# Patient Record
Sex: Female | Born: 2005 | Race: White | Hispanic: No | Marital: Single | Smoking: Never smoker
Health system: Southern US, Community
[De-identification: ages and names within clinical notes are randomized; demographics above are authoritative.]

## PROBLEM LIST (undated history)

## (undated) ENCOUNTER — Ambulatory Visit: Admission: EM | Payer: BC Managed Care – PPO | Source: Home / Self Care

## (undated) ENCOUNTER — Ambulatory Visit

## (undated) DIAGNOSIS — R011 Cardiac murmur, unspecified: Secondary | ICD-10-CM

## (undated) DIAGNOSIS — M0800 Unspecified juvenile rheumatoid arthritis of unspecified site: Secondary | ICD-10-CM

## (undated) DIAGNOSIS — M5481 Occipital neuralgia: Secondary | ICD-10-CM

## (undated) DIAGNOSIS — N83209 Unspecified ovarian cyst, unspecified side: Secondary | ICD-10-CM

## (undated) DIAGNOSIS — IMO0002 Reserved for concepts with insufficient information to code with codable children: Secondary | ICD-10-CM

## (undated) DIAGNOSIS — R609 Edema, unspecified: Secondary | ICD-10-CM

## (undated) DIAGNOSIS — E0590 Thyrotoxicosis, unspecified without thyrotoxic crisis or storm: Secondary | ICD-10-CM

## (undated) DIAGNOSIS — E282 Polycystic ovarian syndrome: Secondary | ICD-10-CM

## (undated) DIAGNOSIS — M081 Juvenile ankylosing spondylitis: Secondary | ICD-10-CM

## (undated) DIAGNOSIS — D649 Anemia, unspecified: Secondary | ICD-10-CM

## (undated) HISTORY — DX: Cardiac murmur, unspecified: R01.1

## (undated) HISTORY — DX: Polycystic ovarian syndrome: E28.2

## (undated) HISTORY — DX: Anemia, unspecified: D64.9

## (undated) HISTORY — DX: Occipital neuralgia: M54.81

## (undated) HISTORY — DX: Edema, unspecified: R60.9

## (undated) HISTORY — PX: WISDOM TOOTH EXTRACTION: SHX21

## (undated) HISTORY — DX: Unspecified ovarian cyst, unspecified side: N83.209

## (undated) HISTORY — DX: Reserved for concepts with insufficient information to code with codable children: IMO0002

## (undated) HISTORY — DX: Unspecified juvenile rheumatoid arthritis of unspecified site: M08.00

## (undated) HISTORY — DX: Juvenile ankylosing spondylitis: M08.1

---
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
## 2023-01-03 NOTE — Telephone Encounter (Signed)
Same recommendations. If pain is still present, ultrasound recommended.

## 2023-01-03 NOTE — Telephone Encounter (Signed)
Got back in touch with pt and she provided me with an update. States that since LMP of 12/25/22-01/01/23, her RLQ pains peaked on the last day and noticed some clotting that day as well but since then has only had some achiness on the right side. States she feels as if cyst had ruptured.  Please advise if recommendations have changed/remained the same? Would you still like pt to schedule an [place] appt? Thanks.

## 2023-01-05 NOTE — Telephone Encounter (Signed)
FYI. Pt scheduled for [place] w/ visit on 02/23/2023. Will add order and route to provider for final review and close encounter.

## 2023-01-13 ENCOUNTER — Ambulatory Visit (INDEPENDENT_AMBULATORY_CARE_PROVIDER_SITE_OTHER): Payer: BC Managed Care – PPO | Admitting: Pediatrics

## 2023-01-13 ENCOUNTER — Encounter (INDEPENDENT_AMBULATORY_CARE_PROVIDER_SITE_OTHER): Payer: Self-pay | Admitting: Pediatrics

## 2023-01-13 VITALS — BP 112/64 | HR 70 | Ht 65.75 in | Wt 150.0 lb

## 2023-01-13 DIAGNOSIS — E0789 Other specified disorders of thyroid: Secondary | ICD-10-CM

## 2023-01-13 DIAGNOSIS — E282 Polycystic ovarian syndrome: Secondary | ICD-10-CM

## 2023-01-13 NOTE — Assessment & Plan Note (Signed)
-  last free testosterone level was normal

## 2023-01-13 NOTE — Progress Notes (Signed)
Pediatric Endocrinology Consultation Follow-up Visit  Michelle Snow Feb 09, 2006 [id number]  HPI: Michelle Snow  is a 17 y.o. female presenting for follow-up of suppressed TSH and history of goiter.  she is accompanied to this visit by her self.  Michelle Snow was last seen at [facility] on 10/14/2022.  Since last visit, she has ovarian rupture February and March 2024. She is under the care of Gyn and has appointment for [place]. She has not started OCP for concern of lymphedema. No symptoms of thyroid disease.   ROS: Greater than 10 systems reviewed with pertinent positives listed in HPI, otherwise neg. The following portions of the patient's history were reviewed and updated as appropriate:  Past Medical History:  has a past medical history of Anemia, Baby premature 28-32 weeks, Heart murmur, JRA (juvenile rheumatoid arthritis), Juvenile ankylosing spondylitis, Lipedema, PCOS (polycystic ovarian syndrome), and Ruptured ovarian cyst.   Meds: Current Outpatient Medications  Medication Instructions   Adalimumab 40 MG/0.4ML PNKT Subcutaneous   folic acid (FOLVITE) 1 MG tablet 1 tablet, Oral, Daily   methotrexate (RHEUMATREX) 2.5 MG tablet Oral   Multiple Vitamin (MULTIVITAMIN) tablet 1 tablet, Oral, Daily,       Allergies: Allergies  Allergen Reactions   Amoxicillin-Pot Clavulanate     REACTION: nausea- but tolerates amoxil- this was verified with mother 02/04/11    Surgical History: No past surgical history on file.   Family History: family history includes Allergies in her mother; Ankylosing spondylitis in her mother; Cancer in her maternal grandmother; Celiac disease in her maternal grandmother and mother; Fibromyalgia in her mother; Hypothyroidism in her maternal aunt, maternal grandmother, and mother.   Social History: Social History   Social History Narrative   Mom is Engineer, site.  Dad is Games developer.      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 23-24 school year.       reports that she has never smoked. She has never used smokeless tobacco. She reports that she does not drink alcohol and does not use drugs.   Physical Exam:  Vitals:   01/13/23 1354  BP: 112/64  Pulse: 70  Weight: 150 lb (68 kg)  Height: 5' 5.75" (1.67 m)   BP 112/64   Pulse 70   Ht 5' 5.75" (1.67 m)   Wt 150 lb (68 kg)   LMP 11/16/2022 (Exact Date)   BMI 24.40 kg/m  Body mass index: body mass index is 24.4 kg/m. Blood pressure %iles are not available for patients who are 18 years or older. 77 %ile (Z= 0.74) based on CDC (Girls, 2-20 Years) BMI-for-age based on BMI available as of 01/13/2023.   Wt Readings from Last 3 Encounters:  01/13/23 150 lb (68 kg) (83 %, Z= 0.94)*  12/21/22 148 lb (67.1 kg) (81 %, Z= 0.88)*  11/12/22 150 lb (68 kg) (83 %, Z= 0.95)*   * Growth percentiles are based on CDC (Girls, 2-20 Years) data.   Ht Readings from Last 3 Encounters:  01/13/23 5' 5.75" (1.67 m) (72 %, Z= 0.58)*  12/21/22 5' 6.5" (1.689 m) (81 %, Z= 0.88)*  11/12/22 5\' 7"  (1.702 m) (86 %, Z= 1.08)*   * Growth percentiles are based on CDC (Girls, 2-20 Years) data.    Physical Exam Vitals reviewed.  Constitutional:      Appearance: Normal appearance. She is not toxic-appearing.  HENT:     Head: Normocephalic and atraumatic.     Nose: Nose normal.     Mouth/Throat:     Mouth:  Mucous membranes are moist.  Eyes:     Extraocular Movements: Extraocular movements intact.  Neck:     Comments: No goiter Pulmonary:     Effort: Pulmonary effort is normal. No respiratory distress.  Abdominal:     General: There is no distension.  Musculoskeletal:        General: Normal range of motion.     Cervical back: Normal range of motion and neck supple. No tenderness.  Lymphadenopathy:     Cervical: No cervical adenopathy.  Skin:    General: Skin is warm.  Neurological:     General: No focal deficit present.     Mental Status: She is alert.     Gait: Gait normal.  Psychiatric:         Mood and Affect: Mood normal.        Behavior: Behavior normal.        Thought Content: Thought content normal.        Judgment: Judgment normal.      Labs: Results for orders placed or performed in visit on 12/21/22  Hemoglobin A1c  Result Value Ref Range   Hgb A1c MFr Bld 5.4 <5.7 % of total Hgb   Mean Plasma Glucose 108 mg/dL   eAG (mmol/L) 6.0 mmol/L  Testos,Total,Free and SHBG (Female)  Result Value Ref Range   Testosterone, Total, LC-MS-MS 62 (H) 2 - 45 ng/dL   Free Testosterone 4.5 0.1 - 6.4 pg/mL   Sex Hormone Binding 81.4 17 - 124 nmol/L    Assessment/Plan: Michelle Snow is a 17 y.o. 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 A diagnosis of PCOS (polycystic ovarian syndrome) was also pertinent to this visit.Michelle Snow.  Michelle Snow was seen today for complex endocrine disorder of thyroid.  Complex endocrine disorder of thyroid Overview: She had suppressed TSH with normal thyroxine and negative thyroid Abs x3 in June 2023. TSI was repeated January 2024 and TSI was normal at <89%. Thus, she is not at risk of developing autoimmune thyroid disease.   Orders: -     T4, free -     TSH -     T3  PCOS (polycystic ovarian syndrome) -     DHEA-sulfate      Follow-up:   Return in about 1 year (around 01/13/2024) for to obtain labs and follow up.   Medical decision-making:  I have personally spent 31 minutes involved in face-to-face and non-face-to-face activities for this patient on the day of the visit. Professional time spent includes the following activities, in addition to those noted in the documentation: preparation time/chart review, ordering of medications/tests/procedures, obtaining and/or reviewing separately obtained history, counseling and educating the patient/family/caregiver, performing a medically appropriate examination and/or evaluation, referring and communicating with other health care professionals for care coordination, and  documentation in the EHR.  Thank you for the opportunity to participate in the care of your patient. Please do not hesitate to contact me should you have any questions regarding the assessment or treatment plan.   Sincerely,   Silvana Newnessolette Zaila Crew, MD  Addendum: Thyroid labs are stable, so no treatment needed at this time. I recommend annual thyroid function tests. DHEA-s is elevated and commonly seen in PCOS.  Latest Reference Range & Units 01/13/23 14:25  TSH mIU/L 0.19 (L)  Triiodothyronine (T3) 86 - 192 ng/dL 161113  W9,UEAV(WUJWJXT4,Free(Direct) 0.8 - 1.4 ng/dL 1.1  (L): Data is abnormally low   Latest Reference Range & Units 01/13/23 14:25  DHEA-SO4 44 - 286  mcg/dL 161323 (H)  (H): Data is abnormally high

## 2023-01-13 NOTE — Assessment & Plan Note (Signed)
-  clinically and biochemially euthyroid -last TSH was still suppressed (last normal 2021) -January 2024 thyroxine level was normal -No treatment is indicated as thyroid hormone levels have continued to be normal and no goiter today -recommend follow up TFTs in 1 year with TSH and free T4 that can be obtained by me or her PCP. Recommend referral to adult endo by adult PCP only if thyroxine level is abnormal.

## 2023-01-13 NOTE — Patient Instructions (Addendum)
Latest Reference Range & Units Most Recent  TSH mIU/L 0.26 (L) 10/14/22 10:49  Triiodothyronine (T3) 86 - 192 ng/dL 469 03/11/94 28:41  L2,GMWN(UUVOZD) 0.8 - 1.4 ng/dL 1.2 03/16/43 03:47  Thyroxine (T4) 5.3 - 11.7 mcg/dL 8.1 01/09/58 56:38  Free Thyroxine Index 1.4 - 3.8  2.5 01/10/22 16:22  Thyroglobulin Ab < or = 1 IU/mL <1 04/05/22 14:19  Thyroperoxidase Ab SerPl-aCnc <9 IU/mL 1 04/05/22 14:19  T3 Uptake 22 - 35 % 31 01/10/22 16:22  THYROID STIMULATING IMMUNOGLOBULIN  Rpt 10/14/22 10:49 <89%  (L): Data is abnormally low Rpt: View report in Results Review for more information  It was a pleasure seeing you today. If TSH is the usual lower end, but still normal thyroxine and normal T3, we have no plans to treat, since the thyroid hormone level is normal. I would recommend having annual TSH and Free T4 levels drawn by your general physician and refer to adult endocrinology if the thyroxine level was to become high or low.   Please let your gynecologist know that a DHEA-s was obtained to trend the level for monitoring of your PCOS.

## 2023-01-14 LAB — TSH: TSH: 0.19 mIU/L — ABNORMAL LOW

## 2023-01-14 LAB — T4, FREE: Free T4: 1.1 ng/dL (ref 0.8–1.4)

## 2023-01-14 LAB — DHEA-SULFATE: DHEA-SO4: 323 ug/dL — ABNORMAL HIGH (ref 44–286)

## 2023-01-14 LAB — T3: T3, Total: 113 ng/dL (ref 86–192)

## 2023-02-23 ENCOUNTER — Other Ambulatory Visit: Payer: BC Managed Care – PPO | Admitting: Nurse Practitioner

## 2023-02-23 ENCOUNTER — Other Ambulatory Visit: Payer: BC Managed Care – PPO

## 2023-02-23 ENCOUNTER — Telehealth: Payer: Self-pay

## 2023-02-23 ENCOUNTER — Other Ambulatory Visit: Payer: Self-pay | Admitting: Nurse Practitioner

## 2023-02-23 DIAGNOSIS — R5383 Other fatigue: Secondary | ICD-10-CM

## 2023-02-23 NOTE — Telephone Encounter (Signed)
Pt scheduled for her earliest availability on 03/01/23 @ 11am for lab only appt.   In meantime, pt advised to push fluids and protein/iron rich diet. Pt voiced understanding. Will route to provider for final review and close.

## 2023-02-23 NOTE — Telephone Encounter (Signed)
Yes, she can schedule lab visit to check for anemia. I also recommend checking for vitamin deficiencies and will place orders for that as well.

## 2023-02-23 NOTE — Telephone Encounter (Signed)
Pt calling to request appt to have iron levels checked. States that she has been dealing w/ sxs of fatigue (coming home after work and napping), low energy, and lightheadedness with standing that's been mainly noticeable/more intense within the last week. Pt reports that she did have a syncopal episode w/out injury for ~32min about a month ago. Denies notifying anyone about it. Denies any more sensations of ovarian cyst ruptures, feels as if there may be one that's formed on rt ovary but nothing too painful at the moment  LMP: 02/18/2023-still on cycle but lightening up.   Please advise.

## 2023-02-23 NOTE — Progress Notes (Deleted)
   Acute Office Visit  Subjective:    Patient ID: Michelle Snow, female    DOB: [birth year], 17 y.o.   MRN: [id number]   HPI 17 y.o. presents today for ultrasound. Seen 12/21/22 with complaints of LLQ pain. Seen 11/16/2022 following ER visit for suspected ruptured right ovarian cyst. This pain has resolved but she is having intermittent mild LLQ pain since then. Physiologic cyst 2.2 cm seen on CT at ER visit.  No LMP recorded.    Review of Systems     Objective:    Physical Exam  There were no vitals taken for this visit. Wt Readings from Last 3 Encounters:  01/13/23 150 lb (68 kg) (83 %, Z= 0.94)*  12/21/22 148 lb (67.1 kg) (81 %, Z= 0.88)*  11/12/22 150 lb (68 kg) (83 %, Z= 0.95)*   * Growth percentiles are based on CDC (Girls, 2-20 Years) data.        Patient informed chaperone available to be present for breast and/or pelvic exam. Patient has requested no chaperone to be present. Patient has been advised what will be completed during breast and pelvic exam.   Assessment & Plan:   Problem List Items Addressed This Visit   None       Olivia Mackie DNP, 7:48 AM 02/23/2023

## 2023-03-01 ENCOUNTER — Other Ambulatory Visit: Payer: BC Managed Care – PPO

## 2023-03-01 DIAGNOSIS — R5383 Other fatigue: Secondary | ICD-10-CM

## 2023-03-04 LAB — COMPREHENSIVE METABOLIC PANEL
AG Ratio: 1.6 (calc) (ref 1.0–2.5)
ALT: 17 U/L (ref 5–32)
AST: 19 U/L (ref 12–32)
Albumin: 4.4 g/dL (ref 3.6–5.1)
Alkaline phosphatase (APISO): 74 U/L (ref 36–128)
BUN: 7 mg/dL (ref 7–20)
CO2: 30 mmol/L (ref 20–32)
Calcium: 9.9 mg/dL (ref 8.9–10.4)
Chloride: 104 mmol/L (ref 98–110)
Creat: 0.86 mg/dL (ref 0.50–0.96)
Globulin: 2.7 g/dL (calc) (ref 2.0–3.8)
Glucose, Bld: 74 mg/dL (ref 65–99)
Potassium: 5.1 mmol/L (ref 3.8–5.1)
Sodium: 140 mmol/L (ref 135–146)
Total Bilirubin: 0.3 mg/dL (ref 0.2–1.1)
Total Protein: 7.1 g/dL (ref 6.3–8.2)

## 2023-03-04 LAB — CBC WITH DIFFERENTIAL/PLATELET
Absolute Monocytes: 942 cells/uL — ABNORMAL HIGH (ref 200–900)
Basophils Absolute: 31 cells/uL (ref 0–200)
Basophils Relative: 0.5 %
Eosinophils Absolute: 68 cells/uL (ref 15–500)
Eosinophils Relative: 1.1 %
HCT: 37.2 % (ref 34.0–46.0)
Hemoglobin: 11.9 g/dL (ref 11.5–15.3)
Lymphs Abs: 1352 cells/uL (ref 1200–5200)
MCH: 27.7 pg (ref 25.0–35.0)
MCHC: 32 g/dL (ref 31.0–36.0)
MCV: 86.5 fL (ref 78.0–98.0)
MPV: 10.8 fL (ref 7.5–12.5)
Monocytes Relative: 15.2 %
Neutro Abs: 3807 cells/uL (ref 1800–8000)
Neutrophils Relative %: 61.4 %
Platelets: 359 10*3/uL (ref 140–400)
RBC: 4.3 10*6/uL (ref 3.80–5.10)
RDW: 13.2 % (ref 11.0–15.0)
Total Lymphocyte: 21.8 %
WBC: 6.2 10*3/uL (ref 4.5–13.0)

## 2023-03-04 LAB — VITAMIN D 25 HYDROXY (VIT D DEFICIENCY, FRACTURES): Vit D, 25-Hydroxy: 33 ng/mL (ref 30–100)

## 2023-03-04 LAB — VITAMIN B12: Vitamin B-12: 504 pg/mL (ref 200–1100)

## 2023-03-22 ENCOUNTER — Ambulatory Visit: Payer: BC Managed Care – PPO | Admitting: Internal Medicine

## 2023-03-22 ENCOUNTER — Encounter: Payer: Self-pay | Admitting: Internal Medicine

## 2023-03-22 VITALS — BP 110/70 | HR 67 | Temp 97.7°F | Ht 65.75 in | Wt 156.0 lb

## 2023-03-22 DIAGNOSIS — G501 Atypical facial pain: Secondary | ICD-10-CM | POA: Insufficient documentation

## 2023-03-22 NOTE — Assessment & Plan Note (Signed)
No mass but has sensation of this Unclear if this could be cranial nerve or cervical abnormality Will set up evaluation with neurology

## 2023-03-22 NOTE — Progress Notes (Signed)
Subjective:    Patient ID: Michelle Snow, female    DOB: 04-23-2006, 17 y.o.   MRN: [id number]  HPI Here due concern about a pocket of fluid on back of head  First episode was 4 years ago--passed out in shower Seen here by Dr G---and passed out again when he touched her head---and again when she had her blood drawn Sent to ER at [facility]--got fluids --no diagnosis  Then felt mass of fluid on forehead in December Went away on its own  These have not come on from any trauma  Now has fluid on the back of her head again--noted 3 days ago No syncope this time Has pain down occiput to neck Gets pain behind right eye---more when sitting up straight In bed--better as long as she doesn't lie on right side  Current Outpatient Medications on File Prior to Visit  Medication Sig Dispense Refill   Adalimumab 40 MG/0.4ML PNKT Inject into the skin.     folic acid (FOLVITE) 1 MG tablet Take 1 tablet by mouth daily.     methotrexate (RHEUMATREX) 2.5 MG tablet Take by mouth.     Multiple Vitamin (MULTIVITAMIN) tablet Take 1 tablet by mouth daily.     No current facility-administered medications on file prior to visit.    Allergies  Allergen Reactions   Amoxicillin-Pot Clavulanate     REACTION: nausea- but tolerates amoxil- this was verified with mother 02/04/11    Past Medical History:  Diagnosis Date   Anemia    Baby premature 28-32 weeks    Heart murmur    JRA (juvenile rheumatoid arthritis) (HCC)    Juvenile ankylosing spondylitis (HCC)    Lipedema    PCOS (polycystic ovarian syndrome)    Ruptured ovarian cyst     History reviewed. No pertinent surgical history.  Family History  Problem Relation Age of Onset   Celiac disease Mother    Hypothyroidism Mother    Allergies Mother    Ankylosing spondylitis Mother    Fibromyalgia Mother    Hypothyroidism Maternal Aunt    Hypothyroidism Maternal Grandmother    Celiac disease Maternal Grandmother    Cancer Maternal  Grandmother        Throat    Social History   Socioeconomic History   Marital status: Single    Spouse name: Not on file   Number of children: Not on file   Years of education: Not on file   Highest education level: Not on file  Occupational History   Not on file  Tobacco Use   Smoking status: Never   Smokeless tobacco: Never   Tobacco comments:    No cigarette smoke exposure  Vaping Use   Vaping Use: Never used  Substance and Sexual Activity   Alcohol use: No   Drug use: No   Sexual activity: Never    Partners: Male    Birth control/protection: Abstinence    Comment: Virgin  Other Topics Concern   Not on file  Social History Narrative   Mom is school Runner, broadcasting/film/video.  Dad is Games developer.      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 23-24 school year.    Social Determinants of Health   Financial Resource Strain: Not on file  Food Insecurity: Not on file  Transportation Needs: Not on file  Physical Activity: Not on file  Stress: Not on file  Social Connections: Not on file  Intimate Partner Violence: Not on file   Review of Systems  No fevers Arthritis is controlled on the meds     Objective:   Physical Exam Constitutional:      Appearance: Normal appearance.  HENT:     Head:     Comments: Slight asymmetry with right forehead above eye mildly larger    Mouth/Throat:     Pharynx: No oropharyngeal exudate or posterior oropharyngeal erythema.  Neck:     Comments: Fairly normal ROM Neurological:     Mental Status: She is alert.     Motor: No weakness, tremor or abnormal muscle tone.     Coordination: Romberg sign negative. Coordination normal.     Gait: Gait normal.     Comments: Cranial nerves normal except for abnormal sensation in V1 on the right Also highly sensitive on right occiput (without clear mass)            Assessment & Plan:

## 2023-03-23 ENCOUNTER — Telehealth: Payer: Self-pay | Admitting: Internal Medicine

## 2023-03-23 ENCOUNTER — Encounter: Payer: Self-pay | Admitting: *Deleted

## 2023-03-23 NOTE — Telephone Encounter (Signed)
Referral faxed.   Patient notified.   See referral notes.

## 2023-03-23 NOTE — Telephone Encounter (Signed)
Pt came by office to let Dr. Alphonsus Sias know that the neurologist office requested that our office fax them the referral. Fax # is [phone]. Call back # [phone]

## 2023-04-11 ENCOUNTER — Other Ambulatory Visit: Payer: Self-pay | Admitting: Physician Assistant

## 2023-04-11 DIAGNOSIS — G500 Trigeminal neuralgia: Secondary | ICD-10-CM

## 2023-04-11 DIAGNOSIS — M542 Cervicalgia: Secondary | ICD-10-CM

## 2023-04-27 ENCOUNTER — Encounter: Payer: Self-pay | Admitting: Nurse Practitioner

## 2023-04-27 ENCOUNTER — Ambulatory Visit (INDEPENDENT_AMBULATORY_CARE_PROVIDER_SITE_OTHER): Payer: BC Managed Care – PPO | Admitting: Nurse Practitioner

## 2023-04-27 ENCOUNTER — Ambulatory Visit (INDEPENDENT_AMBULATORY_CARE_PROVIDER_SITE_OTHER): Payer: BC Managed Care – PPO

## 2023-04-27 VITALS — BP 114/70 | HR 67

## 2023-04-27 DIAGNOSIS — R1031 Right lower quadrant pain: Secondary | ICD-10-CM

## 2023-04-27 NOTE — Progress Notes (Signed)
   Acute Office Visit  Subjective:    Patient ID: Michelle Snow, female    DOB: 02/03/2006, 17 y.o.   MRN: [id number]   HPI 17 y.o. presents today for pelvic ultrasound for RLQ pain. Pain is cyclic, more severe during menses and lingers for about a week. LMP 04/16/23, pain started at that time and is still present today, tender to palpation, getting better. Mildly elevated total testosterone - 62 in March, normal A1c. Does not want to do hormonal birth control for management due to risk of worsening lipoedema.   Patient's last menstrual period was 04/16/2023 (exact date). Period Duration (Days): 7 Period Pattern: Regular Menstrual Flow: Heavy Menstrual Control: Maxi pad, Tampon Dysmenorrhea: (!) Mild Dysmenorrhea Symptoms: Cramping, Headache  Review of Systems  Constitutional: Negative.   Genitourinary:  Positive for pelvic pain (RLQ).       Objective:    Physical Exam Constitutional:      Appearance: Normal appearance.   GU: Not indicated  BP 114/70   Pulse 67   LMP 04/16/2023 (Exact Date)   SpO2 98%  Wt Readings from Last 3 Encounters:  03/22/23 156 lb (70.8 kg) (86%, Z= 1.09)*  01/13/23 150 lb (68 kg) (83%, Z= 0.94)*  12/21/22 148 lb (67.1 kg) (81%, Z= 0.88)*   * Growth percentiles are based on CDC (Girls, 2-20 Years) data.        Assessment & Plan:   Problem List Items Addressed This Visit   None Visit Diagnoses     Right lower quadrant pain    -  Primary      Vaginal ultrasound: Anteverted uterus, normal size and shape, no myometrial masses. Symmetrical endometrium - 4.6 mm. No masses or thickening seen. Both ovaries mobile, normal size with normal follicle pattern and normal perfusion. No adnexal masses, no free fluid.   Plan: Reviewed normal ultrasound findings. Discussed possible endometriosis, diagnosis and management. Still wants to avoid hormonal contraception. Recommend Ibuprofen and heat as needed.      Olivia Mackie DNP, 9:11 AM  04/27/2023

## 2023-05-01 ENCOUNTER — Encounter: Payer: Self-pay | Admitting: Physician Assistant

## 2023-05-02 ENCOUNTER — Other Ambulatory Visit: Payer: BC Managed Care – PPO

## 2023-05-18 ENCOUNTER — Telehealth: Payer: Self-pay | Admitting: Internal Medicine

## 2023-05-18 ENCOUNTER — Other Ambulatory Visit: Payer: Self-pay | Admitting: Neurology

## 2023-05-18 DIAGNOSIS — G500 Trigeminal neuralgia: Secondary | ICD-10-CM

## 2023-05-18 DIAGNOSIS — Z79899 Other long term (current) drug therapy: Secondary | ICD-10-CM

## 2023-05-18 DIAGNOSIS — R519 Headache, unspecified: Secondary | ICD-10-CM

## 2023-05-18 DIAGNOSIS — R220 Localized swelling, mass and lump, head: Secondary | ICD-10-CM

## 2023-05-18 NOTE — Telephone Encounter (Signed)
Called pt to offer lab appt today at 330 and she stated she has to be at work at [employer]. I offered tomorrow and she stated she was going out of town until the 17th. I made her lab appt for 05-29-23.

## 2023-05-18 NOTE — Telephone Encounter (Signed)
Printed from [employer] to give to Dr Alphonsus Sias.

## 2023-05-18 NOTE — Telephone Encounter (Signed)
Patient contacted the office regarding fax request sent over, states her rheumatologist sent over a request for her to have labs drawn at our office. Patient called to ask if fax was received, advised that fax was in Dr. Kristen Cardinal drive folder, he will just have to view it. Patient says rheumatologist's office wanted her to have these done this week, asked if she could come by today or tomorrow. Informed patient that she would not be able to have any labs drawn without orders, so advised I would send this message for Dr. Alphonsus Sias to look over fax and determine if he would like to put these orders in for her.

## 2023-05-28 ENCOUNTER — Ambulatory Visit: Payer: Self-pay

## 2023-05-29 ENCOUNTER — Other Ambulatory Visit: Payer: BC Managed Care – PPO

## 2023-06-07 ENCOUNTER — Encounter: Payer: Self-pay | Admitting: Neurology

## 2023-06-10 ENCOUNTER — Inpatient Hospital Stay: Admission: RE | Admit: 2023-06-10 | Payer: BC Managed Care – PPO | Source: Ambulatory Visit

## 2023-06-10 DIAGNOSIS — R519 Headache, unspecified: Secondary | ICD-10-CM

## 2023-06-10 DIAGNOSIS — R220 Localized swelling, mass and lump, head: Secondary | ICD-10-CM

## 2023-06-10 DIAGNOSIS — G500 Trigeminal neuralgia: Secondary | ICD-10-CM

## 2023-06-19 ENCOUNTER — Other Ambulatory Visit (INDEPENDENT_AMBULATORY_CARE_PROVIDER_SITE_OTHER): Payer: BC Managed Care – PPO

## 2023-06-19 DIAGNOSIS — Z79899 Other long term (current) drug therapy: Secondary | ICD-10-CM

## 2023-06-19 LAB — AST: AST: 42 U/L — ABNORMAL HIGH (ref 0–37)

## 2023-06-19 LAB — ALT: ALT: 45 U/L — ABNORMAL HIGH (ref 0–35)

## 2023-08-14 ENCOUNTER — Ambulatory Visit: Payer: BC Managed Care – PPO | Admitting: Family Medicine

## 2023-08-14 ENCOUNTER — Encounter: Payer: Self-pay | Admitting: Family Medicine

## 2023-08-14 VITALS — BP 120/60 | HR 84 | Temp 98.7°F | Ht 67.0 in | Wt 151.4 lb

## 2023-08-14 DIAGNOSIS — J0100 Acute maxillary sinusitis, unspecified: Secondary | ICD-10-CM

## 2023-08-14 DIAGNOSIS — R509 Fever, unspecified: Secondary | ICD-10-CM

## 2023-08-14 DIAGNOSIS — R7989 Other specified abnormal findings of blood chemistry: Secondary | ICD-10-CM | POA: Diagnosis not present

## 2023-08-14 LAB — POC INFLUENZA A&B (BINAX/QUICKVUE)
Influenza A, POC: NEGATIVE
Influenza B, POC: NEGATIVE

## 2023-08-14 LAB — CBC WITH DIFFERENTIAL/PLATELET
Basophils Absolute: 0 10*3/uL (ref 0.0–0.1)
Basophils Relative: 0.6 % (ref 0.0–3.0)
Eosinophils Absolute: 0 10*3/uL (ref 0.0–0.7)
Eosinophils Relative: 0 % (ref 0.0–5.0)
HCT: 40.8 % (ref 36.0–49.0)
Hemoglobin: 13.1 g/dL (ref 12.0–16.0)
Lymphocytes Relative: 16.4 % — ABNORMAL LOW (ref 24.0–48.0)
Lymphs Abs: 0.6 10*3/uL — ABNORMAL LOW (ref 0.7–4.0)
MCHC: 32.2 g/dL (ref 31.0–37.0)
MCV: 84.9 fL (ref 78.0–98.0)
Monocytes Absolute: 0.5 10*3/uL (ref 0.1–1.0)
Monocytes Relative: 13.7 % — ABNORMAL HIGH (ref 3.0–12.0)
Neutro Abs: 2.5 10*3/uL (ref 1.4–7.7)
Neutrophils Relative %: 69.3 % (ref 43.0–71.0)
Platelets: 231 10*3/uL (ref 150.0–575.0)
RBC: 4.81 Mil/uL (ref 3.80–5.70)
RDW: 15.5 % (ref 11.4–15.5)
WBC: 3.7 10*3/uL — ABNORMAL LOW (ref 4.5–13.5)

## 2023-08-14 LAB — HEPATIC FUNCTION PANEL
ALT: 33 U/L (ref 0–35)
AST: 39 U/L — ABNORMAL HIGH (ref 0–37)
Albumin: 4.2 g/dL (ref 3.5–5.2)
Alkaline Phosphatase: 59 U/L (ref 47–119)
Bilirubin, Direct: 0.1 mg/dL (ref 0.0–0.3)
Total Bilirubin: 0.4 mg/dL (ref 0.2–1.2)
Total Protein: 7.1 g/dL (ref 6.0–8.3)

## 2023-08-14 LAB — POC COVID19 BINAXNOW: SARS Coronavirus 2 Ag: NEGATIVE

## 2023-08-14 MED ORDER — DOXYCYCLINE HYCLATE 100 MG PO TABS: 100.0000 mg | ORAL_TABLET | Freq: Two times a day (BID) | ORAL | 0 refills | Status: DC

## 2023-08-14 NOTE — Progress Notes (Signed)
Michelle Snow T. Michelle Hixon, MD, CAQ Sports Medicine [facility] at [facility] [address] [place], [address]  Phone: [phone]  FAX: [phone]  EXA BOMBA - 17 y.o. female  MRN [id number]  Date of Birth: 2005/11/07  Date: 08/14/2023  PCP: Karie Schwalbe, MD  Referral: Karie Schwalbe, MD  Chief Complaint  Patient presents with   Fever    Started on Friday with 101 temperature- Negative Covid Test yesterday   Nausea   Headache   Subjective:   Michelle Snow is a 17 y.o. very pleasant female patient with Body mass index is 23.71 kg/m. who presents with the following:  Patient is a pleasant 17 year old, she presents today with some ongoing frontal headache, pain behind her eyes, nausea, and she had a fever up to 101 yesterday.  History is complicated due to JRA on methotrexate as well as Hyrimoz.  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   Has some bad periods - blood clots with period Fever 101  Does take methotrexate every Friday. Nauseated Ear L pain Frontal sinus pain  She also has had some elevation in her liver function in context of methotrexate. HFP today    Review of Systems is noted in the HPI, as appropriate  Objective:   BP 120/60 (BP Location: Left Arm, Patient Position: Sitting, Cuff Size: Normal)   Pulse 84   Temp 98.7 F (37.1 C) (Temporal)   Ht 5\' 7"  (1.702 m)   Wt 151 lb 6 oz (68.7 kg)   LMP 08/09/2023   SpO2 99%   BMI 23.71 kg/m    Gen: WDWN, NAD; alert,appropriate and cooperative throughout exam  HEENT: Normocephalic and atraumatic. Throat clear, w/o exudate, no LAD, R TM clear, L TM - good landmarks, No fluid present. rhinnorhea.  Left frontal and maxillary sinuses: Tender frontal Right frontal and maxillary sinuses: Tender frontal  Neck: No ant or post LAD CV: RRR, No M/G/R Pulm: Breathing comfortably in no  resp distress. no w/c/r Psych: full affect, pleasant   Laboratory and Imaging Data:  Assessment and Plan:     ICD-10-CM   1. Acute non-recurrent maxillary sinusitis  J01.00     2. Elevated LFTs  R79.89 Hepatic function panel    3. Fever, unspecified fever cause  R50.9 CBC with Differential/Platelet    POC Influenza A&B (Binax test)    POC COVID-19     She has notable significant frontal sinus pain with some fever yesterday.  The remainder of her exam is normal.  She is on methotrexate, and looking up possible interactions, I do want to avoid penicillin, and doxycycline should have less of a role on methotrexate blood levels.  She also has recently elevated LFTs, we will recheck this today.  Medication Management during today's office visit: Meds ordered this encounter  Medications   doxycycline (VIBRA-TABS) 100 MG tablet    Sig: Take 1 tablet (100 mg total) by mouth 2 (two) times daily.    Dispense:  20 tablet    Refill:  0   Medications Discontinued During This Encounter  Medication Reason   Adalimumab 40 MG/0.4ML PNKT Change in therapy    Orders placed today for conditions managed today: Orders Placed This Encounter  Procedures   Hepatic function panel   CBC with Differential/Platelet   POC Influenza A&B (Binax test)   POC COVID-19    Disposition:  No follow-ups on file.  Dragon [employer] One speech-to-text software was used for transcription in this dictation.  Possible transcriptional errors can occur using Animal nutritionist.   Signed,  Elpidio Galea. Nickolaus Bordelon, MD   Outpatient Encounter Medications as of 08/14/2023  Medication Sig   Adalimumab-adaz (HYRIMOZ) 40 MG/0.4ML SOAJ Inject 40 mg into the skin every 14 (fourteen) days.   doxycycline (VIBRA-TABS) 100 MG tablet Take 1 tablet (100 mg total) by mouth 2 (two) times daily.   folic acid (FOLVITE) 1 MG tablet Take 1 tablet by mouth daily.   gabapentin (NEURONTIN) 100 MG capsule Take 200 mg by mouth 2 (two) times  daily.   methotrexate (RHEUMATREX) 2.5 MG tablet Take by mouth.   Multiple Vitamin (MULTIVITAMIN) tablet Take 1 tablet by mouth daily.   [DISCONTINUED] Adalimumab 40 MG/0.4ML PNKT Inject into the skin.   No facility-administered encounter medications on file as of 08/14/2023.

## 2023-08-31 ENCOUNTER — Telehealth: Payer: Self-pay | Admitting: *Deleted

## 2023-08-31 NOTE — Telephone Encounter (Signed)
Call returned to patient. Patient reports irregular menses for the month of November. Menses11/1 -11/6 with quarter size clots and "lining tissue". Menses started again 11/19, has been heavy, changing full pad q1-2 hours. Intermittent right side cramps when bleeding, reports she has had this in the past. Last PUS 04/27/23. Denies any s/s of anemia. Denies N/V, fever/chills. Not on any contraceptives. Never been sexually active. Patient wants to discuss endometriosis.   OV scheduled for 12/2 at 11am with Tiffany. Advised if new symptoms develop or symptoms worsen, return call to office. ER if afterhours. Patient verbalizes understanding and is agreeable.   Routing to provider for final review. Patient is agreeable to disposition. Will close encounter.

## 2023-09-11 ENCOUNTER — Ambulatory Visit: Payer: BC Managed Care – PPO | Admitting: Nurse Practitioner

## 2023-09-11 ENCOUNTER — Encounter: Payer: Self-pay | Admitting: Nurse Practitioner

## 2023-09-11 VITALS — BP 132/82 | HR 86

## 2023-09-11 DIAGNOSIS — N926 Irregular menstruation, unspecified: Secondary | ICD-10-CM | POA: Diagnosis not present

## 2023-09-11 DIAGNOSIS — Z8639 Personal history of other endocrine, nutritional and metabolic disease: Secondary | ICD-10-CM | POA: Diagnosis not present

## 2023-09-11 DIAGNOSIS — E282 Polycystic ovarian syndrome: Secondary | ICD-10-CM

## 2023-09-11 MED ORDER — METFORMIN HCL 500 MG PO TABS
500.0000 mg | ORAL_TABLET | Freq: Two times a day (BID) | ORAL | 0 refills | Status: DC
Start: 2023-09-11 — End: 2023-12-12

## 2023-09-11 NOTE — Progress Notes (Signed)
   Acute Office Visit  Subjective:    Patient ID: Michelle Snow, female    DOB: 2005/12/23, 17 y.o.   MRN: [id number]   HPI 17 y.o. G0 presents today for irregular bleeding. LMP 10/30-11/6, then again 11/19-11/26, bleeding started again yesterday. Second episode of bleeding was heavy with clots. H/O PCOS, low TSH. Sees endocrinology, who has decided not to start medication unless T3 and/or T4 become abnormal. Last checked April 2024. Strong family history of hyperthyroidism. Not interested in hormonal contraception due to risk of worsening lipedema. OK with starting Metformin. Not sexually active. Normal pelvic ultrasound 04/2023.   Patient's last menstrual period was 09/10/2023 (exact date).    Review of Systems  Constitutional: Negative.   Genitourinary:  Positive for menstrual problem.       Objective:    Physical Exam Constitutional:      Appearance: Normal appearance.  Genitourinary:    General: Normal vulva.     Vagina: Bleeding present.     Cervix: Normal.     Uterus: Normal.      Adnexa: Right adnexa normal and left adnexa normal.     BP 132/82   Pulse 86   LMP 09/10/2023 (Exact Date)   SpO2 99%  Wt Readings from Last 3 Encounters:  08/14/23 151 lb 6 oz (68.7 kg) (82%, Z= 0.92)*  03/22/23 156 lb (70.8 kg) (86%, Z= 1.09)*  01/13/23 150 lb (68 kg) (83%, Z= 0.94)*   * Growth percentiles are based on CDC (Girls, 2-20 Years) data.        Patient informed chaperone available to be present for breast and/or pelvic exam. Patient has requested no chaperone to be present. Patient has been advised what will be completed during breast and pelvic exam.   Assessment & Plan:   Problem List Items Addressed This Visit       Endocrine   PCOS (polycystic ovarian syndrome)   Relevant Medications   metFORMIN (GLUCOPHAGE) 500 MG tablet   Other Visit Diagnoses     Irregular bleeding    -  Primary   Relevant Orders   Thyroid Panel With TSH   History of  hyperthyroidism       Relevant Orders   Thyroid Panel With TSH      Plan: Will check thyroid panel today. Ibuprofen 800 mg every 8 hours to slow down current bleeding. Declines medication for bleeding suppression d/t lipedema. Will start Metformin BID with meals. Aware of initial GI side effects. Follow up to be determined.   Return if symptoms worsen or fail to improve.    Michelle Mackie DNP, 11:16 AM 09/11/2023

## 2023-09-12 LAB — THYROID PANEL WITH TSH
Free Thyroxine Index: 2.2 (ref 1.4–3.8)
T3 Uptake: 29 % (ref 22–35)
T4, Total: 7.6 ug/dL (ref 5.3–11.7)
TSH: 0.3 m[IU]/L — ABNORMAL LOW

## 2023-10-08 ENCOUNTER — Other Ambulatory Visit: Payer: Self-pay

## 2023-10-08 ENCOUNTER — Emergency Department: Payer: BC Managed Care – PPO

## 2023-10-08 ENCOUNTER — Emergency Department
Admission: EM | Admit: 2023-10-08 | Discharge: 2023-10-08 | Disposition: A | Discharge status: Home / Self Care | Payer: BC Managed Care – PPO | Attending: Student in an Organized Health Care Education/Training Program | Admitting: Student in an Organized Health Care Education/Training Program

## 2023-10-08 DIAGNOSIS — R1031 Right lower quadrant pain: Secondary | ICD-10-CM | POA: Insufficient documentation

## 2023-10-08 DIAGNOSIS — R112 Nausea with vomiting, unspecified: Secondary | ICD-10-CM | POA: Insufficient documentation

## 2023-10-08 DIAGNOSIS — R519 Headache, unspecified: Secondary | ICD-10-CM | POA: Diagnosis not present

## 2023-10-08 LAB — COMPREHENSIVE METABOLIC PANEL
ALT: 40 U/L (ref 0–44)
AST: 32 U/L (ref 15–41)
Albumin: 3.9 g/dL (ref 3.5–5.0)
Alkaline Phosphatase: 49 U/L (ref 38–126)
Anion gap: 12 (ref 5–15)
BUN: 12 mg/dL (ref 6–20)
CO2: 24 mmol/L (ref 22–32)
Calcium: 8.8 mg/dL — ABNORMAL LOW (ref 8.9–10.3)
Chloride: 99 mmol/L (ref 98–111)
Creatinine, Ser: 0.95 mg/dL (ref 0.44–1.00)
GFR, Estimated: >60 mL/min (ref >60)
Glucose, Bld: 104 mg/dL — ABNORMAL HIGH (ref 70–99)
Potassium: 3.6 mmol/L (ref 3.5–5.1)
Sodium: 135 mmol/L (ref 135–145)
Total Bilirubin: 0.7 mg/dL (ref <1.2)
Total Protein: 7.1 g/dL (ref 6.5–8.1)

## 2023-10-08 LAB — CBC WITH DIFFERENTIAL/PLATELET
Abs Immature Granulocytes: 0.02 10*3/uL (ref 0.00–0.07)
Basophils Absolute: 0 10*3/uL (ref 0.0–0.1)
Basophils Relative: 0 %
Eosinophils Absolute: 0 10*3/uL (ref 0.0–0.5)
Eosinophils Relative: 0 %
HCT: 37 % (ref 36.0–46.0)
Hemoglobin: 12.2 g/dL (ref 12.0–15.0)
Immature Granulocytes: 0 %
Lymphocytes Relative: 10 %
Lymphs Abs: 0.8 10*3/uL (ref 0.7–4.0)
MCH: 28.3 pg (ref 26.0–34.0)
MCHC: 33 g/dL (ref 30.0–36.0)
MCV: 85.8 fL (ref 80.0–100.0)
Monocytes Absolute: 0.5 10*3/uL (ref 0.1–1.0)
Monocytes Relative: 6 %
Neutro Abs: 6.7 10*3/uL (ref 1.7–7.7)
Neutrophils Relative %: 84 %
Platelets: 341 10*3/uL (ref 150–400)
RBC: 4.31 MIL/uL (ref 3.87–5.11)
RDW: 15.1 % (ref 11.5–15.5)
WBC: 8 10*3/uL (ref 4.0–10.5)
nRBC: 0 % (ref 0.0–0.2)

## 2023-10-08 LAB — URINALYSIS, ROUTINE W REFLEX MICROSCOPIC
RBC / HPF: >50 RBC/hpf (ref 0–5)
WBC, UA: >50 WBC/hpf (ref 0–5)

## 2023-10-08 LAB — LIPASE, BLOOD: Lipase: 87 U/L — ABNORMAL HIGH (ref 11–51)

## 2023-10-08 LAB — PREGNANCY, URINE: Preg Test, Ur: NEGATIVE

## 2023-10-08 LAB — LACTIC ACID, PLASMA: Lactic Acid, Venous: 1.3 mmol/L (ref 0.5–1.9)

## 2023-10-08 MED ORDER — IOHEXOL 300 MG/ML  SOLN
100.0000 mL | Freq: Once | INTRAMUSCULAR | Status: AC | PRN
  Administered 2023-10-08: 100 mL via INTRAVENOUS

## 2023-10-08 MED ORDER — ONDANSETRON 4 MG PO TBDP: 4.0000 mg | ORAL_TABLET | Freq: Three times a day (TID) | ORAL | 0 refills | Status: DC | PRN

## 2023-10-08 MED ORDER — ONDANSETRON HCL 4 MG/2ML IJ SOLN
4.0000 mg | Freq: Once | INTRAMUSCULAR | Status: AC
  Administered 2023-10-08: 4 mg via INTRAVENOUS
  Filled 2023-10-08: qty 2

## 2023-10-08 MED ORDER — SODIUM CHLORIDE 0.9 % IV BOLUS: 500.0000 mL | Freq: Once | INTRAVENOUS | Status: DC

## 2023-10-08 NOTE — ED Provider Triage Note (Signed)
Emergency Medicine Provider Triage Evaluation Note  Michelle Snow , a 17 y.o. female  was evaluated in triage.  Pt complains of walk in clinic and was sent here for appendicitis work up. RLQ pain that began yesterday. Has had ovarian cyst ruptures in the past. Reports she was febrile and tachycardic at the walk in clinic.  Review of Systems  Positive: RLQ pain, vomiting Negative: diarrhea  Physical Exam  LMP 09/10/2023 (Exact Date)  Gen:   Awake, no distress   Resp:  Normal effort  MSK:   Moves extremities without difficulty  Other:    Medical Decision Making  Medically screening exam initiated at 3:52 PM.  Appropriate orders placed.  Michelle Snow was informed that the remainder of the evaluation will be completed by another provider, this initial triage assessment does not replace that evaluation, and the importance of remaining in the ED until their evaluation is complete.     Cameron Ali, PA-C 10/08/23 1558

## 2023-10-08 NOTE — ED Provider Notes (Signed)
[facility] Provider Note    Event Date/Time   First MD Initiated Contact with Patient 10/08/23 1958     (approximate)   History   Abdominal Pain   HPI  Michelle Snow is a 17 y.o. female who presents to the ER for evaluation of right lower quadrant abdominal pain.  States that she had symptoms last night when she woke up with a headache and also had some nausea vomiting having vomited roughly 8 times today.  No hematemesis.  Has had some low-grade temperature.  Went to [facility] clinic today at [facility] was given Zofran with significant improvement in symptoms she was tested negative for COVID RSV and flu.  Given her right lower quadrant pain was sent to the ER for further evaluation.  She is currently on her menstrual cycle.  Had a similar presentation in February of last year.     Physical Exam   Triage Vital Signs: ED Triage Vitals  Encounter Vitals Group     BP 10/08/23 1554 (!) 142/66     Systolic BP Percentile --      Diastolic BP Percentile --      Pulse Rate 10/08/23 1554 92     Resp 10/08/23 1554 18     Temp 10/08/23 1554 99.1 F (37.3 C)     Temp Source 10/08/23 1554 Oral     SpO2 10/08/23 1554 99 %     Weight --      Height --      Head Circumference --      Peak Flow --      Pain Score 10/08/23 1555 6     Pain Loc --      Pain Education --      Exclude from Growth Chart --     Most recent vital signs: Vitals:   10/08/23 1554  BP: (!) 142/66  Pulse: 92  Resp: 18  Temp: 99.1 F (37.3 C)  SpO2: 99%     Constitutional: Alert  Eyes: Conjunctivae are normal.  Head: Atraumatic. Nose: No congestion/rhinnorhea. Mouth/Throat: Mucous membranes are moist.   Neck: Painless ROM.  Cardiovascular:   Good peripheral circulation. Respiratory: Normal respiratory effort.  No retractions.  Gastrointestinal: Soft with mild tenderness to palpation but no guarding or rebound. Musculoskeletal:  no deformity Neurologic:  MAE spontaneously.  No gross focal neurologic deficits are appreciated.  Skin:  Skin is warm, dry and intact. No rash noted. Psychiatric: Mood and affect are normal. Speech and behavior are normal.    ED Results / Procedures / Treatments   Labs (all labs ordered are listed, but only abnormal results are displayed) Labs Reviewed  COMPREHENSIVE METABOLIC PANEL - Abnormal; Notable for the following components:      Result Value   Glucose, Bld 104 (*)    Calcium 8.8 (*)    All other components within normal limits  LIPASE, BLOOD - Abnormal; Notable for the following components:   Lipase 87 (*)    All other components within normal limits  URINALYSIS, ROUTINE W REFLEX MICROSCOPIC - Abnormal; Notable for the following components:   Color, Urine RED (*)    APPearance TURBID (*)    Glucose, UA   (*)    Value: TEST NOT REPORTED DUE TO COLOR INTERFERENCE OF URINE PIGMENT   Hgb urine dipstick   (*)    Value: TEST NOT REPORTED DUE TO COLOR INTERFERENCE OF URINE PIGMENT   Bilirubin Urine   (*)  Value: TEST NOT REPORTED DUE TO COLOR INTERFERENCE OF URINE PIGMENT   Ketones, ur   (*)    Value: TEST NOT REPORTED DUE TO COLOR INTERFERENCE OF URINE PIGMENT   Protein, ur   (*)    Value: TEST NOT REPORTED DUE TO COLOR INTERFERENCE OF URINE PIGMENT   Nitrite   (*)    Value: TEST NOT REPORTED DUE TO COLOR INTERFERENCE OF URINE PIGMENT   Leukocytes,Ua   (*)    Value: TEST NOT REPORTED DUE TO COLOR INTERFERENCE OF URINE PIGMENT   Bacteria, UA FEW (*)    All other components within normal limits  CBC WITH DIFFERENTIAL/PLATELET  LACTIC ACID, PLASMA  PREGNANCY, URINE  LACTIC ACID, PLASMA     EKG     RADIOLOGY Please see ED Course for my review and interpretation.  I personally reviewed all radiographic images ordered to evaluate for the above acute complaints and reviewed radiology reports and findings.  These findings were personally discussed with the patient.  Please see medical record for radiology  report.    PROCEDURES:  Critical Care performed: No  Procedures   MEDICATIONS ORDERED IN ED: Medications  sodium chloride 0.9 % bolus 500 mL (has no administration in time range)  ondansetron (ZOFRAN) injection 4 mg (4 mg Intravenous Given 10/08/23 1605)  iohexol (OMNIPAQUE) 300 MG/ML solution 100 mL (100 mLs Intravenous Contrast Given 10/08/23 1907)  ondansetron (ZOFRAN) injection 4 mg (4 mg Intravenous Given 10/08/23 2025)     IMPRESSION / MDM / ASSESSMENT AND PLAN / ED COURSE  I reviewed the triage vital signs and the nursing notes.                              Differential diagnosis includes, but is not limited to, appendicitis, colitis, diverticulitis, UTI, stone, cyst, torsion, enteritis  Patient presenting to the ER for evaluation of symptoms as described above.  Based on symptoms, risk factors and considered above differential, this presenting complaint could reflect a potentially life-threatening illness therefore the patient will be placed on continuous pulse oximetry and telemetry for monitoring.  Laboratory evaluation will be sent to evaluate for the above complaints.  CT imaging ordered triage on my review and interpretation without evidence of obstruction.  No sign of appendicitis or other acute abnormality at this point.  Can be seen quite a bit of viral GI illness slightly and I suspect that she has same illness right now.  Offered IV fluids.  She would like to try p.o.  Tolerating p.o. will be appropriate for outpatient follow-up.         FINAL CLINICAL IMPRESSION(S) / ED DIAGNOSES   Final diagnoses:  Right lower quadrant abdominal pain  Nausea and vomiting, unspecified vomiting type     Rx / DC Orders   ED Discharge Orders          Ordered    ondansetron (ZOFRAN-ODT) 4 MG disintegrating tablet  Every 8 hours PRN        10/08/23 2028             Note:  This document was prepared using Dragon voice recognition software and may include  unintentional dictation errors.    Willy Eddy, MD 10/08/23 2028

## 2023-10-08 NOTE — Discharge Instructions (Signed)
You have been seen in the emergency department for emergency care. It is important that you contact your own doctor, specialist or the closest clinic for follow-up care. Please bring this instruction sheet, all medications and X-ray copies with you when you are seen for follow-up care. ° °Determining the exact cause for all patients with abdominal pain is extremely difficult in the emergency department. Our primary focus is to rule-out immediate life-threatening diseases. If no immediate source of pain is found the definitive diagnosis frequently needs to be determined over time.Many times your primary care physician can determine the cause by following the symptoms over time. Sometimes, specialist are required such as Gastroenterologists, Gynecologists, Urologists or Surgeons. Please return °immediately to the Emergency Department for fever>101, Vomiting or Intractable Pain. You should return to the emergency department or see your primary care provider in 12-24hrs if your pain is no better and °sooner if your pain becomes worse. ° °

## 2023-10-08 NOTE — ED Notes (Signed)
Pt approached B side nursing station stating that she needed to use the restroom. Patient provided with a urine cup at this time.

## 2023-10-08 NOTE — ED Triage Notes (Signed)
Pt c/o RLQ pain since yesterday with n/v. Pt has hx of ovarian cysts. Pt denies abdominal surgical hx. Pt took motrin at 1200. Pt here to r/o appendicitis.

## 2023-10-09 ENCOUNTER — Telehealth: Payer: Self-pay | Admitting: *Deleted

## 2023-10-09 NOTE — Telephone Encounter (Signed)
Appears to be GI related based on ER notes/imaging. Ibuprofen for pain. Follow up with PCP.

## 2023-10-09 NOTE — Telephone Encounter (Signed)
Spoke with patient. Patient f/u from ER visit on 10/08/23. Reports fatigue, N/V and fever started on 10/06/23. LMP 10/07/23, 1 week late. Changing full pad q2 hours. Menses was 1 week late. States she is not sexually active. UPT neg at ER 12/29. Pain 5 out of 10 on pain scale in right lower abdomen/pelvis. CT scan completed during ER visit. Zofran is helping with nausea, no vomiting. Was able to drink fluids last night, no food. States she was advised to f/u with PCP or GYN due to Hx of PCOS. Has not checked temp. Denies chills or diarrhea.   Advised will review with provider and f/u with recommendations, patient agreeable.   Tiffany -please review and advise on f/u.

## 2023-10-09 NOTE — Telephone Encounter (Signed)
Spoke with patient, advised per Tiffany. Patient verbalizes understanding and is agreeable.  Encounter closed.   

## 2023-10-13 ENCOUNTER — Emergency Department: Payer: Self-pay

## 2023-10-13 ENCOUNTER — Ambulatory Visit: Payer: Self-pay | Admitting: Internal Medicine

## 2023-10-13 ENCOUNTER — Other Ambulatory Visit: Payer: Self-pay

## 2023-10-13 ENCOUNTER — Emergency Department
Admission: EM | Admit: 2023-10-13 | Discharge: 2023-10-14 | Disposition: A | Discharge status: Home / Self Care | Payer: Self-pay | Attending: Emergency Medicine | Admitting: Emergency Medicine

## 2023-10-13 DIAGNOSIS — R1031 Right lower quadrant pain: Secondary | ICD-10-CM | POA: Insufficient documentation

## 2023-10-13 DIAGNOSIS — R112 Nausea with vomiting, unspecified: Secondary | ICD-10-CM | POA: Insufficient documentation

## 2023-10-13 LAB — URINALYSIS, ROUTINE W REFLEX MICROSCOPIC
Bilirubin Urine: NEGATIVE
Glucose, UA: NEGATIVE mg/dL
Ketones, ur: NEGATIVE mg/dL
Leukocytes,Ua: NEGATIVE
Nitrite: NEGATIVE
Protein, ur: 30 mg/dL — AB
Specific Gravity, Urine: 1.029 (ref 1.005–1.030)
pH: 5 (ref 5.0–8.0)

## 2023-10-13 LAB — COMPREHENSIVE METABOLIC PANEL
ALT: 35 U/L (ref 0–44)
AST: 29 U/L (ref 15–41)
Albumin: 4.4 g/dL (ref 3.5–5.0)
Alkaline Phosphatase: 53 U/L (ref 38–126)
Anion gap: 10 (ref 5–15)
BUN: 10 mg/dL (ref 6–20)
CO2: 25 mmol/L (ref 22–32)
Calcium: 9.2 mg/dL (ref 8.9–10.3)
Chloride: 103 mmol/L (ref 98–111)
Creatinine, Ser: 0.95 mg/dL (ref 0.44–1.00)
GFR, Estimated: >60 mL/min (ref >60)
Glucose, Bld: 98 mg/dL (ref 70–99)
Potassium: 3.7 mmol/L (ref 3.5–5.1)
Sodium: 138 mmol/L (ref 135–145)
Total Bilirubin: 0.7 mg/dL (ref 0.0–1.2)
Total Protein: 7.6 g/dL (ref 6.5–8.1)

## 2023-10-13 LAB — CBC
HCT: 37.8 % (ref 36.0–46.0)
Hemoglobin: 12.4 g/dL (ref 12.0–15.0)
MCH: 28.1 pg (ref 26.0–34.0)
MCHC: 32.8 g/dL (ref 30.0–36.0)
MCV: 85.5 fL (ref 80.0–100.0)
Platelets: 394 10*3/uL (ref 150–400)
RBC: 4.42 MIL/uL (ref 3.87–5.11)
RDW: 14.4 % (ref 11.5–15.5)
WBC: 5.4 10*3/uL (ref 4.0–10.5)
nRBC: 0 % (ref 0.0–0.2)

## 2023-10-13 LAB — LIPASE, BLOOD: Lipase: 119 U/L — ABNORMAL HIGH (ref 11–51)

## 2023-10-13 LAB — POC URINE PREG, ED: Preg Test, Ur: NEGATIVE

## 2023-10-13 MED ORDER — ONDANSETRON 4 MG PO TBDP
4.0000 mg | ORAL_TABLET | Freq: Once | ORAL | Status: AC
  Administered 2023-10-13: 4 mg via ORAL
  Filled 2023-10-13: qty 1

## 2023-10-13 MED ORDER — IOHEXOL 300 MG/ML  SOLN
100.0000 mL | Freq: Once | INTRAMUSCULAR | Status: AC | PRN
  Administered 2023-10-13: 100 mL via INTRAVENOUS

## 2023-10-13 NOTE — Telephone Encounter (Signed)
 Chief Complaint: abdominal pain Symptoms: pain, nausea Frequency: 10/08/23 Pertinent Negatives: Patient denies SOB, vomiting, blood in stool, CP Disposition: [] ED /[x] Urgent Care (no appt availability in office) / [] Appointment(In office/virtual)/ []  [facility]/ [] Home Care/ [] Refused Recommended Disposition /[] [facility]/ []  Follow-up with PCP Additional Notes: Patient calls stating she has been sick x 1 week- reports nausea and vomiting (resolved with medication), pain in R lower quadrant of abdomen that radiates to back, fever (resolved), decreased appetite and fluid intake. States she had a LOC due to pain and was evaluated in the ED, per patient- diagnosed with Norovirus. Per protocol, pt to be evaluated within 4 hours, next available with any provider 10/16/23 @ 0915. Patient agreeable to urgent care for eval. Care advice reviewed, patient verbalized understanding. Alerting PCP for review.   Copied from CRM [id number]. Topic: Clinical - Red Word Triage >> Oct 13, 2023 12:30 PM Suzette B wrote: [facility] that prompted transfer to Nurse Triage: possible appendicitis patient is experience extreme right lower quadrant, pain is getting worse, has been  to er yesterday, patient stated she passed out Sunday morning. Reason for Disposition  [1] MILD-MODERATE pain AND [2] constant AND [3] present > 2 hours  Answer Assessment - Initial Assessment Questions 1. LOCATION: Where does it hurt?      Right lower quadrant abdominal pain 2. RADIATION: Does the pain shoot anywhere else? (e.g., chest, back)     From front to back 3. ONSET: When did the pain begin? (e.g., minutes, hours or days ago)      12 /27/24 4. SUDDEN: Gradual or sudden onset?     Sudden 5. PATTERN Does the pain come and go, or is it constant?    - If it comes and goes: How long does it last? Do you have pain now?     (Note: Comes and goes means the pain is intermittent. It goes away completely  between bouts.)    - If constant: Is it getting better, staying the same, or getting worse?      (Note: Constant means the pain never goes away completely; most serious pain is constant and gets worse.)      Constant 6. SEVERITY: How bad is the pain?  (e.g., Scale 1-10; mild, moderate, or severe)    - MILD (1-3): Doesn't interfere with normal activities, abdomen soft and not tender to touch.     - MODERATE (4-7): Interferes with normal activities or awakens from sleep, abdomen tender to touch.     - SEVERE (8-10): Excruciating pain, doubled over, unable to do any normal activities.       4/10 7. RECURRENT SYMPTOM: Have you ever had this type of stomach pain before? If Yes, ask: When was the last time? and What happened that time?      Denies 8. CAUSE: What do you think is causing the stomach pain?     Unknown 9. RELIEVING/AGGRAVATING FACTORS: What makes it better or worse? (e.g., antacids, bending or twisting motion, bowel movement)     Pressure feels worse, states muscles on that side are flexed and will not relax. 10. OTHER SYMPTOMS: Do you have any other symptoms? (e.g., back pain, diarrhea, fever, urination pain, vomiting)       Nausea and vomiting, fever last week, vomiting up to 10/09/23 11. PREGNANCY: Is there any chance you are pregnant? When was your last menstrual period?       Denies, LMP: currently menstruating  Protocols used: Abdominal Pain -  Female-A-AH

## 2023-10-13 NOTE — ED Provider Notes (Signed)
 [place] EMERGENCY DEPARTMENT AT [facility] Provider Note   CSN: [id number] Arrival date & time: 10/13/23  1323     History  Chief Complaint  Patient presents with   Abdominal Pain    Michelle Snow is a 18 y.o. female.  With history of rheumatoid arthritis presents with right lower quadrant abdominal pain.  Symptoms began on 10/07/2023, developed right lower quadrant pain with nausea vomiting.  She presented to the ED where she had negative blood work as well as a negative CT scan of the abdomen and pelvis.  She continues to have symptoms of pain that is moderate along the right lower quadrant is worse with eating and movement.  She denies any current vomiting but has been on nausea medication.  She does not take any medications for pain.  HPI     Home Medications Prior to Admission medications   Medication Sig Start Date End Date Taking? Authorizing Provider  ondansetron  (ZOFRAN -ODT) 4 MG disintegrating tablet Take 1 tablet (4 mg total) by mouth every 8 (eight) hours as needed for nausea or vomiting. 10/14/23  Yes Charlene Debby BROCKS, PA-C  Adalimumab-adaz (HYRIMOZ) 40 MG/0.4ML SOAJ Inject 40 mg into the skin once a week.    [provider]  folic acid (FOLVITE) 1 MG tablet Take 1 tablet by mouth daily. 07/03/18   [provider]  gabapentin (NEURONTIN) 100 MG capsule Take 300 mg by mouth 2 (two) times daily. 06/21/23   [provider]  metFORMIN  (GLUCOPHAGE ) 500 MG tablet Take 1 tablet (500 mg total) by mouth 2 (two) times daily with a meal. 09/11/23   Prentiss Annabella DELENA, NP  methotrexate (RHEUMATREX) 2.5 MG tablet Take by mouth. 07/26/22 07/26/25  [provider]  Multiple Vitamin (MULTIVITAMIN) tablet Take 1 tablet by mouth daily.    [provider]      Allergies    Amoxicillin -pot clavulanate    Review of Systems   Review of Systems  Physical Exam Updated Vital Signs BP 126/77 (BP Location: Right Arm)   Pulse 60    Temp 98.7 F (37.1 C) (Oral)   Resp 16   Ht 5' 7 (1.702 m)   Wt 67.1 kg   LMP 10/12/2023   SpO2 100%   BMI 23.18 kg/m  Physical Exam Constitutional:      Appearance: She is well-developed.  HENT:     Head: Normocephalic and atraumatic.  Eyes:     Conjunctiva/sclera: Conjunctivae normal.  Cardiovascular:     Rate and Rhythm: Normal rate and regular rhythm.     Pulses: Normal pulses.     Heart sounds: Normal heart sounds.  Pulmonary:     Effort: Pulmonary effort is normal. No respiratory distress.  Abdominal:     General: Abdomen is flat. There is no distension.     Palpations: Abdomen is soft. There is no shifting dullness.     Tenderness: There is abdominal tenderness in the right lower quadrant. There is no right CVA tenderness, left CVA tenderness or guarding. Positive signs include McBurney's sign. Negative signs include Murphy's sign.  Musculoskeletal:        General: Normal range of motion.     Cervical back: Normal range of motion.  Skin:    General: Skin is warm.     Findings: No rash.  Neurological:     General: No focal deficit present.     Mental Status: She is alert and oriented to person, place, and time.  Psychiatric:  Mood and Affect: Mood normal.        Behavior: Behavior normal.        Thought Content: Thought content normal.     ED Results / Procedures / Treatments   Labs (all labs ordered are listed, but only abnormal results are displayed) Labs Reviewed  LIPASE, BLOOD - Abnormal; Notable for the following components:      Result Value   Lipase 119 (*)    All other components within normal limits  URINALYSIS, ROUTINE W REFLEX MICROSCOPIC - Abnormal; Notable for the following components:   Color, Urine YELLOW (*)    APPearance HAZY (*)    Hgb urine dipstick MODERATE (*)    Protein, ur 30 (*)    Bacteria, UA RARE (*)    All other components within normal limits  URINE CULTURE  COMPREHENSIVE METABOLIC PANEL  CBC  POC URINE PREG, ED     EKG None  Radiology CT ABDOMEN PELVIS W CONTRAST Result Date: 10/13/2023 CLINICAL DATA:  Right lower quadrant abdominal pain radiating to back EXAM: CT ABDOMEN AND PELVIS WITH CONTRAST TECHNIQUE: Multidetector CT imaging of the abdomen and pelvis was performed using the standard protocol following bolus administration of intravenous contrast. RADIATION DOSE REDUCTION: This exam was performed according to the departmental dose-optimization program which includes automated exposure control, adjustment of the mA and/or kV according to patient size and/or use of iterative reconstruction technique. CONTRAST:  OMNIPAQUE  IOHEXOL  300 MG/ML  SOLN COMPARISON:  Same day pelvic ultrasound and CT abdomen and pelvis FINDINGS: Lower chest: No acute abnormality. Hepatobiliary: Unremarkable liver. Normal gallbladder. No biliary dilation. Pancreas: Unremarkable. Spleen: Unremarkable. Adrenals/Urinary Tract: Normal adrenal glands. No urinary calculi or hydronephrosis. Bladder is unremarkable. Stomach/Bowel: Normal caliber large and small bowel. No bowel wall thickening. The appendix is normal.Stomach is within normal limits. Vascular/Lymphatic: No significant vascular findings are present. No enlarged abdominal or pelvic lymph nodes. Reproductive: Unremarkable. Other: Trace free fluid in the pelvis.  No free intraperitoneal air. Musculoskeletal: No acute fracture. IMPRESSION: 1. No acute abnormality in the abdomen or pelvis. Normal appendix. Electronically Signed   By: Norman Gatlin M.D.   On: 10/13/2023 23:33   US  PELVIS (TRANSABDOMINAL ONLY) Result Date: 10/13/2023 CLINICAL DATA:  Right-sided abdominal pain EXAM: TRANSABDOMINAL ULTRASOUND OF PELVIS TECHNIQUE: Transabdominal ultrasound examination of the pelvis was performed including evaluation of the uterus, ovaries, adnexal regions, and pelvic cul-de-sac. COMPARISON:  CT abdomen and pelvis 10/08/2023. pelvic ultrasound 04/27/2023. FINDINGS: Uterus Measurements:  6.4 x 3.5 x 3.9 cm = volume: 45 mL. No fibroids or other mass visualized. Endometrium Thickness: 5.7 mm.  No focal abnormality visualized. Right ovary Measurements: 4.1 x 2.3 x 2.1 cm = volume: 10 mL. Normal appearance/no adnexal mass. Left ovary Measurements: 3.2 x 2.0 x 2.5 cm = volume: 8 mL. Normal appearance/no adnexal mass. Other findings:  No abnormal free fluid. IMPRESSION: Normal pelvic ultrasound. Electronically Signed   By: Greig Pique M.D.   On: 10/13/2023 21:51    Procedures Procedures    Medications Ordered in ED Medications  iohexol  (OMNIPAQUE ) 300 MG/ML solution 100 mL (100 mLs Intravenous Contrast Given 10/13/23 2318)  ondansetron  (ZOFRAN -ODT) disintegrating tablet 4 mg (4 mg Oral Given 10/13/23 2329)    ED Course/ Medical Decision Making/ A&P                                 Medical Decision Making Amount and/or Complexity of Data Reviewed  Labs: ordered. Radiology: ordered.  Risk Prescription drug management.  18 year old with right lower quadrant abdominal pain and nausea.  She has been afebrile nontachycardic.  Pain has been moderate.  She had a negative workup in the ED 5 days ago but due to persistent pain patient and family very concerned about possible appendicitis.  Patient's blood work today shows no elevated white count, slightly elevated lipase but normal bili, AST and ALT.  Remainder of BMP within normal limits.  Patient with moderate right lower quadrant pain.  History of ovarian cyst, ultrasound intravaginal for pelvis evaluation shows no ovarian cyst or ovarian abnormalities.  Patient and family concerned about continued appendicitis and I offered ultrasound abdomen to evaluate appendix but they requested CT scan due to being much more sensitive imaging modality.  CT scan was repeated and negative for appendicitis.  There was no acute changes seen on CT scan.  Patient appears stable, vital signs are stable.  She is tolerating p.o. well.  Will have her follow-up  with GI outpatient.  Final Clinical Impression(s) / ED Diagnoses Final diagnoses:  Right lower quadrant abdominal pain    Rx / DC Orders ED Discharge Orders          Ordered    ondansetron  (ZOFRAN -ODT) 4 MG disintegrating tablet  Every 8 hours PRN        10/14/23 0011              Charlene Debby BROCKS, PA-C 10/14/23 0015    Suzanne Kirsch, MD 10/14/23 [phone]

## 2023-10-13 NOTE — ED Triage Notes (Signed)
 Pt comes with c/o belly pain that has now moved to her back. Pt was seen here on Sunday due to belly pain and evaluated. Pt states scan did show some fluid in her intestine. Pt states she hasn't been able to eat.  Pt states it was some on right sided and radiating to her back.

## 2023-10-14 MED ORDER — ONDANSETRON 4 MG PO TBDP: 4.0000 mg | ORAL_TABLET | Freq: Three times a day (TID) | ORAL | 0 refills | Status: DC | PRN

## 2023-10-14 NOTE — Discharge Instructions (Signed)
 Please continue with Zofran as needed for nausea.  Take Tylenol as needed for pain.  Follow-up with GI specialist Monday to schedule follow-up appointment and discuss labs, imaging.

## 2023-10-15 LAB — URINE CULTURE: Culture: <10000 — AB

## 2023-10-16 ENCOUNTER — Encounter: Payer: Self-pay | Admitting: Internal Medicine

## 2023-10-16 NOTE — Telephone Encounter (Signed)
 Left message to see how she was doing and to see if she was going to set up her GI appointment or if she wanted a referral. I did advise that doing a referral would not get her seen any quicker. Will wait to hear back from her before closing the note.

## 2023-10-24 ENCOUNTER — Other Ambulatory Visit: Payer: Self-pay | Admitting: Family Medicine

## 2023-10-24 MED ORDER — OSELTAMIVIR PHOSPHATE 75 MG PO CAPS: 75.0000 mg | ORAL_CAPSULE | Freq: Every day | ORAL | 0 refills | Status: DC

## 2023-10-24 NOTE — Progress Notes (Signed)
 Patent's mother seen at office... Mom and dad with flu.  Pt immunocompromised. Sent in prophylactic tamiflu.

## 2023-10-25 NOTE — Telephone Encounter (Signed)
 Patient sent update via mychart message. Dr. Joelle Musca is aware.

## 2023-11-20 ENCOUNTER — Telehealth: Payer: Self-pay | Admitting: Family Medicine

## 2023-11-20 ENCOUNTER — Encounter: Payer: Self-pay | Admitting: Family Medicine

## 2023-11-20 ENCOUNTER — Telehealth: Payer: Self-pay

## 2023-11-20 ENCOUNTER — Ambulatory Visit: Payer: Self-pay | Admitting: Internal Medicine

## 2023-11-20 DIAGNOSIS — J989 Respiratory disorder, unspecified: Secondary | ICD-10-CM

## 2023-11-20 DIAGNOSIS — R6889 Other general symptoms and signs: Secondary | ICD-10-CM

## 2023-11-20 MED ORDER — NIRMATRELVIR/RITONAVIR (PAXLOVID)TABLET: 3.0000 | ORAL_TABLET | Freq: Two times a day (BID) | ORAL | 0 refills | Status: AC

## 2023-11-20 MED ORDER — OSELTAMIVIR PHOSPHATE 75 MG PO CAPS: 75.0000 mg | ORAL_CAPSULE | Freq: Every day | ORAL | 0 refills | Status: DC

## 2023-11-20 NOTE — Telephone Encounter (Signed)
Sending to treating provider.  

## 2023-11-20 NOTE — Addendum Note (Signed)
 Addended by: Charlotta Cook A on: 11/20/2023 05:16 PM   Modules accepted: Orders

## 2023-11-20 NOTE — Telephone Encounter (Signed)
 Copied from CRM [id number]. Topic: Clinical - Medication Question >> Nov 20, 2023  4:15 PM Corin V wrote: Reason for CRM: Patient was seen via Telehealth today for flu symptoms and has taken 1 dose of her Tamaflu. She retested herself for COVID this afternoon and is now positive for COVID. She would like paxlovid  or a similar med called in if possible. She also stated she is immunocompromised.

## 2023-11-20 NOTE — Telephone Encounter (Signed)
 Please send WE and SE for 2/10 thru 2/16 due to COVID Send letters via mychart thanks

## 2023-11-20 NOTE — Telephone Encounter (Signed)
 Okay---glad she could get a visit today

## 2023-11-20 NOTE — Telephone Encounter (Signed)
 Chief Complaint: cough, sore throat Symptoms: cough, sore throat Frequency: hours, started at 0200 today Pertinent Negatives: Patient denies SOB,   Disposition: [] ED /[] Urgent Care (no appt availability in office) / [x] Appointment(In office/virtual)/ []  [facility]/ [] Home Care/ [] Refused Recommended Disposition /[] [facility]/ []  Follow-up with PCP  Additional Notes: Pt with known exposure lives with father who has tested positive for covid. Pt is immunocompromised. Pt would like paxlovid . Pt took advil while on phone. Pt sched for VV. Temp 99.5, HR 82, 98% SPO2.  Copied from CRM [id number]. Topic: Clinical - Red Word Triage >> Nov 20, 2023 10:27 AM Tisa Forester wrote: Red Word that prompted transfer to Nurse Triage: have a fever low grade 99.5 sore throat and congested nose , fluid in ears  patient has lighted headed due to medication she is on , (patient stated that she is immune compromise) patient dad had covid a few days ago Reason for Disposition  [1] HIGH RISK patient AND [2] influenza exposure within the last 7 days AND [3] ONE OR MORE respiratory symptoms: cough, sore throat, runny or stuffy nose  Answer Assessment - Initial Assessment Questions 1. COVID-19 DIAGNOSIS: "How do you know that you have COVID?" (e.g., positive lab test or self-test, diagnosed by doctor or NP/PA, symptoms after exposure).     Unsure if have covid no at home covid test. Pt states that her father was dx with covid last week 2. COVID-19 EXPOSURE: "Was there any known exposure to COVID before the symptoms began?" CDC Definition of close contact: within 6 feet (2 meters) for a total of 15 minutes or more over a 24-hour period.      Live with dad who was dx with covid last week 3. ONSET: "When did the COVID-19 symptoms start?"      0200 this morning 4. WORST SYMPTOM: "What is your worst symptom?" (e.g., cough, fever, shortness of breath, muscle aches)     Congested nose 5. COUGH: "Do you  have a cough?" If Yes, ask: "How bad is the cough?"       mild 6. FEVER: "Do you have a fever?" If Yes, ask: "What is your temperature, how was it measured, and when did it start?"     99.5 7. RESPIRATORY STATUS: "Describe your breathing?" (e.g., normal; shortness of breath, wheezing, unable to speak)      Normal 8. BETTER-SAME-WORSE: "Are you getting better, staying the same or getting worse compared to yesterday?"  If getting worse, ask, "In what way?"     Wasn't sick yesterday 9. OTHER SYMPTOMS: "Do you have any other symptoms?"  (e.g., chills, fatigue, headache, loss of smell or taste, muscle pain, sore throat)     Sore throat, fatigue, HA 10. HIGH RISK DISEASE: "Do you have any chronic medical problems?" (e.g., asthma, heart or lung disease, weak immune system, obesity, etc.)       arthritis 11. VACCINE: "Have you had the COVID-19 vaccine?" If Yes, ask: "Which one, how many shots, when did you get it?"       denies 12. PREGNANCY: "Is there any chance you are pregnant?" "When was your last menstrual period?"       denies 32. O2 SATURATION MONITOR:  "Do you use an oxygen saturation monitor (pulse oximeter) at home?" If Yes, ask "What is your reading (oxygen level) today?" "What is your usual oxygen saturation reading?" (e.g., 95%)       98%, HR 82  Protocols used: Coronavirus (COVID-19) Diagnosed or  Suspected-A-AH

## 2023-11-20 NOTE — Telephone Encounter (Signed)
 I did discuss with the patient.  She took another COVID test that was positive.  I checked drug interactions there are none.  We went ahead and prescribed Paxil bid.  Her GFR looks good.  She will take this over the next 5 days She is aware of the warning signs to watch for and to follow-up in person with ourselves or her regular primary care doctor depending on any problems or issues  Front office please send patient MyChart work excuse and school excuse for this whole week thank you

## 2023-11-20 NOTE — Progress Notes (Addendum)
   Subjective:    Patient ID: Michelle Snow, female    DOB: 2006/08/12, 18 y.o.   MRN: [id number]  HPI Virtual Visit via Video Note  I connected with Michelle Snow on 11/20/23 at 11:20 AM EST by a video enabled telemedicine application and verified that I am speaking with the correct person using two identifiers.  Location: Patient: Home Provider: Office   I discussed the limitations of evaluation and management by telemedicine and the availability of in person appointments. The patient expressed understanding and agreed to proceed.  History of Present Illness:    Observations/Objective:   Assessment and Plan:   Follow Up Instructions:    I discussed the assessment and treatment plan with the patient. The patient was provided an opportunity to ask questions and all were answered. The patient agreed with the plan and demonstrated an understanding of the instructions.   The patient was advised to call back or seek an in-person evaluation if the symptoms worsen or if the condition fails to improve as anticipated.  I provided 15 minutes of non-face-to-face time during this encounter.   Charlotta Cook, MD  Patient presents today with respiratory illness Number of days present-2 days started yesterday  Symptoms include-runny nose sore throat congestion head congestion no chest congestion some bodyaches feeling fatigued tired  Presence of worrisome signs (severe shortness of breath, lethargy, etc.) -none  Recent/current visit to urgent care or ER-none  Recent direct exposure to Covid-dad has COVID 4 days ago  Any current Covid testing-negative today home test    Review of Systems     Objective:   Physical Exam Patient does not appear toxic Patient had virtual visit-video Appears to be in no distress Atraumatic Neuro able to relate and oriented No apparent resp distress Color normal      Assessment & Plan:  Flulike symptoms Given her symptoms  possible flu Doubt COVID but her father was diagnosed with COVID a few days ago Her COVID test was negative The patient relates that she is on medications for her arthritis which increases her immune deficiency but not having any you pulmonary shortness of breath given her Signs and Symptoms of COVID-19 (e.g., fever, dry cough, and/or SOB), no diagnosis made (assign the appropriate code(s)):    Recommend Tamiflu  twice daily for 5 days for presumed influenza Recommend that the patient repeat her COVID test in 2 days if positive connect with her primary care doctor if any further questions may address it to us  as well if she has any follow-up questions Certainly if progressive cough congestion shortness of breath get checked out in person    Addendum- patient retested Mon afternoon and was positive for covid. Therefore stop tamiflu  and start Paxlovid  GFR normal Neg drug interactions Warning signs reviewed Evaluate in person if worse CC;Dr.Letvak

## 2023-11-21 ENCOUNTER — Encounter: Payer: Self-pay | Admitting: Family Medicine

## 2023-12-09 ENCOUNTER — Other Ambulatory Visit: Payer: Self-pay | Admitting: Nurse Practitioner

## 2023-12-09 DIAGNOSIS — E282 Polycystic ovarian syndrome: Secondary | ICD-10-CM

## 2023-12-11 NOTE — Telephone Encounter (Signed)
 Medication refill request: metformin 500mg  Last ov:  09/11/23  Next AEX: not scheduled  Last MMG (if hormonal medication request): n/a Refill authorized: please advise

## 2023-12-12 NOTE — Telephone Encounter (Signed)
Call to schedule annual exam.

## 2023-12-14 ENCOUNTER — Other Ambulatory Visit: Payer: Self-pay

## 2023-12-14 ENCOUNTER — Encounter: Payer: Self-pay | Admitting: Emergency Medicine

## 2023-12-14 ENCOUNTER — Emergency Department
Admission: EM | Admit: 2023-12-14 | Discharge: 2023-12-14 | Discharge status: Against Medical Advice | Attending: Emergency Medicine | Admitting: Emergency Medicine

## 2023-12-14 ENCOUNTER — Telehealth: Payer: Self-pay | Admitting: Emergency Medicine

## 2023-12-14 DIAGNOSIS — Z5321 Procedure and treatment not carried out due to patient leaving prior to being seen by health care provider: Secondary | ICD-10-CM | POA: Insufficient documentation

## 2023-12-14 DIAGNOSIS — T7840XA Allergy, unspecified, initial encounter: Secondary | ICD-10-CM | POA: Insufficient documentation

## 2023-12-14 NOTE — Telephone Encounter (Signed)
 Patient presents to [facility] for evaluation of chest tightness, swelling sore throat after being exposed to amoxicillin "in the lab at school" had a reaction on Tuesday and got better after Benadryl.  Today she went back to lab, began to have swelling in her throat and took Benadryl.  Has had no relief with some worsening.  Spoke with patient with Hansel Starling, APP and mother and explained what we could do for her (epi pen).  O2 is stable (98%) and patient denies SOB at this time, so she decided to head to the emergency room with mom for further evaluation.

## 2023-12-14 NOTE — ED Notes (Signed)
 No answer when called several times from lobby

## 2023-12-14 NOTE — ED Triage Notes (Signed)
 Pt via POV from home. Pt c/o allergic reaction, pt has an allergy to amoxicillin and was exposed to it on Tuesday, states it started with a rash and she went home on Tuesday and took benadryl and it got better. Today she was exposed again around 1030 and took benadryl at 2:00pm but states her throat feels itchy. Denies any other symptoms. Able to swallow saliva. Denies SOB. Pt is A&Ox4 and NAD

## 2023-12-21 ENCOUNTER — Encounter: Payer: Self-pay | Admitting: Gastroenterology

## 2023-12-21 ENCOUNTER — Encounter
Admission: RE | Disposition: A | Discharge status: Home / Self Care | Payer: Self-pay | Source: Home / Self Care | Attending: Gastroenterology

## 2023-12-21 ENCOUNTER — Ambulatory Visit: Admitting: Registered Nurse

## 2023-12-21 ENCOUNTER — Ambulatory Visit
Admission: RE | Admit: 2023-12-21 | Discharge: 2023-12-21 | Disposition: A | Discharge status: Home / Self Care | Attending: Gastroenterology | Admitting: Gastroenterology

## 2023-12-21 DIAGNOSIS — K529 Noninfective gastroenteritis and colitis, unspecified: Secondary | ICD-10-CM | POA: Diagnosis present

## 2023-12-21 DIAGNOSIS — Z83719 Family history of colon polyps, unspecified: Secondary | ICD-10-CM | POA: Diagnosis not present

## 2023-12-21 DIAGNOSIS — K648 Other hemorrhoids: Secondary | ICD-10-CM | POA: Diagnosis not present

## 2023-12-21 DIAGNOSIS — E119 Type 2 diabetes mellitus without complications: Secondary | ICD-10-CM | POA: Diagnosis not present

## 2023-12-21 DIAGNOSIS — Z7984 Long term (current) use of oral hypoglycemic drugs: Secondary | ICD-10-CM | POA: Insufficient documentation

## 2023-12-21 HISTORY — DX: Thyrotoxicosis, unspecified without thyrotoxic crisis or storm: E05.90

## 2023-12-21 HISTORY — PX: ESOPHAGOGASTRODUODENOSCOPY: SHX5428

## 2023-12-21 HISTORY — PX: COLONOSCOPY: SHX5424

## 2023-12-21 LAB — POCT PREGNANCY, URINE: Preg Test, Ur: NEGATIVE

## 2023-12-21 SURGERY — COLONOSCOPY
Anesthesia: General

## 2023-12-21 MED ORDER — PROPOFOL 10 MG/ML IV BOLUS
INTRAVENOUS | Status: DC | PRN
  Administered 2023-12-21 (×2): 50 mg via INTRAVENOUS
  Administered 2023-12-21: 100 mg via INTRAVENOUS

## 2023-12-21 MED ORDER — LIDOCAINE HCL (PF) 2 % IJ SOLN
INTRAMUSCULAR | Status: AC
  Filled 2023-12-21: qty 5

## 2023-12-21 MED ORDER — DEXMEDETOMIDINE HCL IN NACL 80 MCG/20ML IV SOLN
INTRAVENOUS | Status: DC | PRN
  Administered 2023-12-21: 12 ug via INTRAVENOUS

## 2023-12-21 MED ORDER — PROPOFOL 500 MG/50ML IV EMUL
INTRAVENOUS | Status: DC | PRN
  Administered 2023-12-21: 125 ug/kg/min via INTRAVENOUS

## 2023-12-21 MED ORDER — LIDOCAINE HCL (CARDIAC) PF 100 MG/5ML IV SOSY
PREFILLED_SYRINGE | INTRAVENOUS | Status: DC | PRN
  Administered 2023-12-21: 60 mg via INTRAVENOUS

## 2023-12-21 MED ORDER — PROPOFOL 10 MG/ML IV BOLUS
INTRAVENOUS | Status: AC
  Filled 2023-12-21: qty 20

## 2023-12-21 MED ORDER — DEXMEDETOMIDINE HCL IN NACL 80 MCG/20ML IV SOLN
INTRAVENOUS | Status: AC
  Filled 2023-12-21: qty 20

## 2023-12-21 MED ORDER — SODIUM CHLORIDE 0.9 % IV SOLN: INTRAVENOUS | Status: DC

## 2023-12-21 NOTE — Op Note (Signed)
 [facility] Gastroenterology Patient Name: Michelle Snow Procedure Date: 12/21/2023 9:19 AM MRN: [id number] Account #: [id number] Date of Birth: 10/21/2005 Admit Type: Outpatient Age: 18 Room: [facility] ENDO ROOM 1 Gender: Female Note Status: Finalized Instrument Name: Peds Colonoscope [id number] Procedure:             Colonoscopy Indications:           Chronic diarrhea Providers:             Jaynie Collins DO, DO Referring MD:          Karie Schwalbe (Referring MD) Medicines:             Monitored Anesthesia Care Complications:         No immediate complications. Estimated blood loss:                         Minimal. Procedure:             Pre-Anesthesia Assessment:                        - Prior to the procedure, a History and Physical was                         performed, and patient medications and allergies were                         reviewed. The patient is competent. The risks and                         benefits of the procedure and the sedation options and                         risks were discussed with the patient. All questions                         were answered and informed consent was obtained.                         Patient identification and proposed procedure were                         verified by the physician, the nurse, the anesthetist                         and the technician in the endoscopy suite. Mental                         Status Examination: alert and oriented. Airway                         Examination: normal oropharyngeal airway and neck                         mobility. Respiratory Examination: clear to                         auscultation. CV Examination: RRR, no murmurs, no S3  or S4. Prophylactic Antibiotics: The patient does not                         require prophylactic antibiotics. Prior                         Anticoagulants: The patient has taken no anticoagulant                          or antiplatelet agents. ASA Grade Assessment: II - A                         patient with mild systemic disease. After reviewing                         the risks and benefits, the patient was deemed in                         satisfactory condition to undergo the procedure. The                         anesthesia plan was to use monitored anesthesia care                         (MAC). Immediately prior to administration of                         medications, the patient was re-assessed for adequacy                         to receive sedatives. The heart rate, respiratory                         rate, oxygen saturations, blood pressure, adequacy of                         pulmonary ventilation, and response to care were                         monitored throughout the procedure. The physical                         status of the patient was re-assessed after the                         procedure.                        After obtaining informed consent, the colonoscope was                         passed under direct vision. Throughout the procedure,                         the patient's blood pressure, pulse, and oxygen                         saturations were monitored continuously. The  Colonoscope was introduced through the anus and                         advanced to the the terminal ileum, with                         identification of the appendiceal orifice and IC                         valve. The colonoscopy was performed without                         difficulty. The patient tolerated the procedure well.                         The quality of the bowel preparation was evaluated                         using the BBPS [facility] Bowel Preparation Scale) with                         scores of: Right Colon = 3, Transverse Colon = 3 and                         Left Colon = 3 (entire mucosa seen well with no                         residual staining, small  fragments of stool or opaque                         liquid). The total BBPS score equals 9. The terminal                         ileum, ileocecal valve, appendiceal orifice, and                         rectum were photographed. Findings:      The perianal and digital rectal examinations were normal. Pertinent       negatives include normal sphincter tone.      The terminal ileum appeared normal. Biopsies were taken with a cold       forceps for histology. Estimated blood loss was minimal.      Retroflexion in the right colon was performed.      The lumen of the colon (entire examined portion) was mildly dilated.       Estimated blood loss: none.      Normal mucosa was found in the entire colon. Biopsies for histology were       taken with a cold forceps from the right colon, left colon and       transverse colon for evaluation of microscopic colitis. Estimated blood       loss was minimal.      Non-bleeding internal hemorrhoids were found during retroflexion. The       hemorrhoids were small. Estimated blood loss: none.      The exam was otherwise without abnormality on direct and retroflexion       views. Impression:            -  The examined portion of the ileum was normal.                         Biopsied.                        - Dilated in the entire examined colon.                        - Normal mucosa in the entire examined colon. Biopsied.                        - Non-bleeding internal hemorrhoids.                        - The examination was otherwise normal on direct and                         retroflexion views. Recommendation:        - Patient has a contact number available for                         emergencies. The signs and symptoms of potential                         delayed complications were discussed with the patient.                         Return to normal activities tomorrow. Written                         discharge instructions were provided to the  patient.                        - Discharge patient to home (ambulatory).                        - Resume previous diet.                        - Continue present medications.                        - Await pathology results.                        - Repeat colonoscopy for surveillance based on                         pathology results.                        - Return to GI office as previously scheduled.                        - The findings and recommendations were discussed with                         the patient.                        - The findings  and recommendations were discussed with                         the patient's family. Procedure Code(s):     --- Professional ---                        [phone], Colonoscopy, flexible; with biopsy, single or                         multiple Diagnosis Code(s):     --- Professional ---                        K64.8, Other hemorrhoids                        K59.39, Other megacolon                        K52.9, Noninfective gastroenteritis and colitis,                         unspecified CPT copyright 2022 American Medical Association. All rights reserved. The codes documented in this report are preliminary and upon coder review may  be revised to meet current compliance requirements. Attending Participation:      I personally performed the entire procedure. Elfredia Nevins, DO Jaynie Collins DO, DO 12/21/2023 9:55:23 AM This report has been signed electronically. Number of Addenda: 0 Note Initiated On: 12/21/2023 9:19 AM Scope Withdrawal Time: 0 hours 7 minutes 57 seconds  Total Procedure Duration: 0 hours 13 minutes 19 seconds  Estimated Blood Loss:  Estimated blood loss was minimal.      [facility]

## 2023-12-21 NOTE — Interval H&P Note (Signed)
 History and Physical Interval Note: Preprocedure H&P from 12/21/23  was reviewed and there was no interval change after seeing and examining the patient.  Written consent was obtained from the patient after discussion of risks, benefits, and alternatives. Patient has consented to proceed with Esophagogastroduodenoscopy and Colonoscopy with possible intervention   12/21/2023 9:18 AM  Michelle Snow  has presented today for surgery, with the diagnosis of Diarrhea, unspecified type (R19.7) Generalized abdominal pain (R10.84).  The various methods of treatment have been discussed with the patient and family. After consideration of risks, benefits and other options for treatment, the patient has consented to  Procedure(s): COLONOSCOPY (N/A) EGD (ESOPHAGOGASTRODUODENOSCOPY) (N/A) as a surgical intervention.  The patient's history has been reviewed, patient examined, no change in status, stable for surgery.  I have reviewed the patient's chart and labs.  Questions were answered to the patient's satisfaction.     Jaynie Collins

## 2023-12-21 NOTE — Op Note (Signed)
 [facility] Gastroenterology Patient Name: Michelle Snow Procedure Date: 12/21/2023 9:20 AM MRN: [id number] Account #: [id number] Date of Birth: 2006-05-25 Admit Type: Outpatient Age: 18 Room: [facility] ENDO ROOM 1 Gender: Female Note Status: Finalized Instrument Name: Laurette Schimke [id number] Procedure:             Upper GI endoscopy Indications:           Abdominal pain, Diarrhea Providers:             Trenda Moots, DO Referring MD:          Karie Schwalbe (Referring MD) Medicines:             Monitored Anesthesia Care Complications:         No immediate complications. Estimated blood loss:                         Minimal. Procedure:             Pre-Anesthesia Assessment:                        - Prior to the procedure, a History and Physical was                         performed, and patient medications and allergies were                         reviewed. The patient is competent. The risks and                         benefits of the procedure and the sedation options and                         risks were discussed with the patient. All questions                         were answered and informed consent was obtained.                         Patient identification and proposed procedure were                         verified by the physician, the nurse, the anesthetist                         and the technician in the endoscopy suite. Mental                         Status Examination: alert and oriented. Airway                         Examination: normal oropharyngeal airway and neck                         mobility. Respiratory Examination: clear to                         auscultation. CV Examination: RRR, no murmurs, no S3  or S4. Prophylactic Antibiotics: The patient does not                         require prophylactic antibiotics. Prior                         Anticoagulants: The patient has taken no anticoagulant                          or antiplatelet agents. ASA Grade Assessment: II - A                         patient with mild systemic disease. After reviewing                         the risks and benefits, the patient was deemed in                         satisfactory condition to undergo the procedure. The                         anesthesia plan was to use monitored anesthesia care                         (MAC). Immediately prior to administration of                         medications, the patient was re-assessed for adequacy                         to receive sedatives. The heart rate, respiratory                         rate, oxygen saturations, blood pressure, adequacy of                         pulmonary ventilation, and response to care were                         monitored throughout the procedure. The physical                         status of the patient was re-assessed after the                         procedure.                        After obtaining informed consent, the endoscope was                         passed under direct vision. Throughout the procedure,                         the patient's blood pressure, pulse, and oxygen                         saturations were monitored continuously. The Endoscope  was introduced through the mouth, and advanced to the                         third part of duodenum. The upper GI endoscopy was                         accomplished without difficulty. The patient tolerated                         the procedure well. Findings:      The duodenal bulb, first portion of the duodenum, second portion of the       duodenum and third portion of the duodenum were normal. Biopsies for       histology were taken with a cold forceps for evaluation of celiac       disease. Estimated blood loss was minimal.      The entire examined stomach was normal. Biopsies were taken with a cold       forceps for Helicobacter pylori testing.  Estimated blood loss was       minimal.      The Z-line was regular. Estimated blood loss: none.      Esophagogastric landmarks were identified: the gastroesophageal junction       was found at 40 cm from the incisors.      The examined esophagus was normal. Estimated blood loss: none. Impression:            - Normal duodenal bulb, first portion of the duodenum,                         second portion of the duodenum and third portion of                         the duodenum. Biopsied.                        - Normal stomach. Biopsied.                        - Z-line regular.                        - Esophagogastric landmarks identified.                        - Normal esophagus. Recommendation:        - Patient has a contact number available for                         emergencies. The signs and symptoms of potential                         delayed complications were discussed with the patient.                         Return to normal activities tomorrow. Written                         discharge instructions were provided to the patient.                        -  Discharge patient to home.                        - Resume previous diet.                        - Continue present medications.                        - Await pathology results.                        - Return to GI clinic as previously scheduled.                        - proceed with colonoscopy. see report for further                         recommendations.                        - The findings and recommendations were discussed with                         the patient.                        - The findings and recommendations were discussed with                         the patient's family. Procedure Code(s):     --- Professional ---                        [phone], Esophagogastroduodenoscopy, flexible,                         transoral; with biopsy, single or multiple Diagnosis Code(s):     --- Professional ---                         R10.9, Unspecified abdominal pain                        R19.7, Diarrhea, unspecified CPT copyright 2022 American Medical Association. All rights reserved. The codes documented in this report are preliminary and upon coder review may  be revised to meet current compliance requirements. Attending Participation:      I personally performed the entire procedure. Elfredia Nevins, DO Jaynie Collins DO, DO 12/21/2023 9:32:18 AM This report has been signed electronically. Number of Addenda: 0 Note Initiated On: 12/21/2023 9:20 AM Estimated Blood Loss:  Estimated blood loss was minimal.      [facility]

## 2023-12-21 NOTE — Transfer of Care (Signed)
 Immediate Anesthesia Transfer of Care Note  Patient: Michelle Snow  Procedure(s) Performed: COLONOSCOPY EGD (ESOPHAGOGASTRODUODENOSCOPY)  Patient Location: Endoscopy Unit  Anesthesia Type:General  Level of Consciousness: sedated  Airway & Oxygen Therapy: Patient Spontanous Breathing  Post-op Assessment: Report given to RN and Post -op Vital signs reviewed and stable  Post vital signs: Reviewed and stable  Last Vitals:  Vitals Value Taken Time  BP 90/49 12/21/23 0953  Temp    Pulse 62 12/21/23 0954  Resp 18 12/21/23 0954  SpO2 98 % 12/21/23 0954  Vitals shown include unfiled device data.  Last Pain:  Vitals:   12/21/23 0844  TempSrc: Temporal         Complications: No notable events documented.

## 2023-12-21 NOTE — Anesthesia Postprocedure Evaluation (Signed)
 Anesthesia Post Note  Patient: Michelle Snow  Procedure(s) Performed: COLONOSCOPY EGD (ESOPHAGOGASTRODUODENOSCOPY)  Patient location during evaluation: Endoscopy Anesthesia Type: General Level of consciousness: awake and alert Pain management: pain level controlled Vital Signs Assessment: post-procedure vital signs reviewed and stable Respiratory status: spontaneous breathing, nonlabored ventilation, respiratory function stable and patient connected to nasal cannula oxygen Cardiovascular status: blood pressure returned to baseline and stable Postop Assessment: no apparent nausea or vomiting Anesthetic complications: no   No notable events documented.   Last Vitals:  Vitals:   12/21/23 1014 12/21/23 1015  BP: (!) 88/74 99/72  Pulse: 66 69  Resp: 16 16  Temp:    SpO2: 100% 99%    Last Pain:  Vitals:   12/21/23 0950  TempSrc: Temporal                 Lenard Simmer

## 2023-12-21 NOTE — Anesthesia Preprocedure Evaluation (Signed)
 Anesthesia Evaluation  Patient identified by MRN, date of birth, ID band Patient awake    Reviewed: Allergy & Precautions, H&P , NPO status , Patient's Chart, lab work & pertinent test results, reviewed documented beta blocker date and time   History of Anesthesia Complications Negative for: history of anesthetic complications  Airway Mallampati: III  TM Distance: >3 FB Neck ROM: full    Dental  (+) Dental Advidsory Given, Teeth Intact   Pulmonary neg pulmonary ROS   Pulmonary exam normal breath sounds clear to auscultation       Cardiovascular Exercise Tolerance: Good (-) hypertension(-) angina (-) Past MI and (-) Cardiac Stents Normal cardiovascular exam(-) dysrhythmias + Valvular Problems/Murmurs (as a child, grew out of it)  Rhythm:regular Rate:Normal     Neuro/Psych  Headaches, neg Seizures  Neuromuscular disease  negative psych ROS   GI/Hepatic negative GI ROS, Neg liver ROS,,,  Endo/Other  negative endocrine ROSneg diabetes    Renal/GU negative Renal ROS  negative genitourinary   Musculoskeletal   Abdominal   Peds  Hematology negative hematology ROS (+)   Anesthesia Other Findings Past Medical History: No date: Anemia No date: Baby premature 28-32 weeks No date: Heart murmur No date: 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 No date: Occipital neuralgia No date: PCOS (polycystic ovarian syndrome) No date: Ruptured ovarian cyst No date: Subclinical hyperthyroidism   Reproductive/Obstetrics negative OB ROS                             Anesthesia Physical Anesthesia Plan  ASA: 2  Anesthesia Plan: General   Post-op Pain Management:    Induction: Intravenous  PONV Risk Score and Plan: 3 and Propofol infusion, TIVA and Treatment may vary due to age or medical condition  Airway Management Planned: Natural  Airway and Nasal Cannula  Additional Equipment:   Intra-op Plan:   Post-operative Plan:   Informed Consent: I have reviewed the patients History and Physical, chart, labs and discussed the procedure including the risks, benefits and alternatives for the proposed anesthesia with the patient or authorized representative who has indicated his/her understanding and acceptance.     Dental Advisory Given  Plan Discussed with: Anesthesiologist, CRNA and Surgeon  Anesthesia Plan Comments:         Anesthesia Quick Evaluation

## 2023-12-21 NOTE — Anesthesia Procedure Notes (Signed)
 Procedure Name: General with mask airway Date/Time: 12/21/2023 9:16 AM  Performed by: Lily Lovings, CRNAPre-anesthesia Checklist: Patient identified, Emergency Drugs available, Suction available and Patient being monitored Patient Re-evaluated:Patient Re-evaluated prior to induction Oxygen Delivery Method: Simple face mask Preoxygenation: Pre-oxygenation with 100% oxygen Induction Type: IV induction Comments: pom

## 2023-12-21 NOTE — H&P (Signed)
 Pre-Procedure H&P   Patient ID: Michelle Snow is a 18 y.o. female.  Gastroenterology Provider: Jaynie Collins, DO  PCP: Karie Schwalbe, MD  Date: 12/21/2023  HPI [name] is a 18 y.o. female who presents today for Esophagogastroduodenoscopy and Colonoscopy for Diarrhea, bright red blood per rectum, abdominal pain, weight loss .  Patient has had ongoing diarrhea abdominal pain since December 2024.  She went to the emergency department at that time where CT only noted enteritis.  Repeat CT demonstrated resolution.  She has lost 20 pounds but gained 10 of them back.  Currently has 2 bowel movements daily, and notes blood with wiping.  EGD and colonoscopy in October 2021 were normal.  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  Past Medical History:  Diagnosis Date   Anemia    Baby premature 28-32 weeks    Heart murmur    JRA (juvenile rheumatoid arthritis) (HCC)    Juvenile ankylosing spondylitis (HCC)    Lipedema    Occipital neuralgia    PCOS (polycystic ovarian syndrome)    Ruptured ovarian cyst    Subclinical hyperthyroidism     Past Surgical History:  Procedure Laterality Date   WISDOM TOOTH EXTRACTION      Family History Grandmother and cousins celiac disease Grandmother and great grandfather and aunt esophageal cancer Grandfather colon polyps No other h/o GI disease or malignancy  Review of Systems  Constitutional:  Positive for appetite change and unexpected weight change. Negative for activity change, chills, diaphoresis, fatigue and fever.  HENT:  Negative for trouble swallowing and voice change.   Respiratory:  Negative for shortness of breath and wheezing.   Cardiovascular:  Negative for chest pain, palpitations and leg swelling.  Gastrointestinal:  Positive for abdominal pain and blood in stool. Negative for abdominal distention, anal bleeding,  constipation, diarrhea, nausea, rectal pain and vomiting.  Musculoskeletal:  Negative for arthralgias and myalgias.  Skin:  Negative for color change and pallor.  Neurological:  Negative for dizziness, syncope and weakness.  Psychiatric/Behavioral:  Negative for confusion.   All other systems reviewed and are negative.    Medications No current facility-administered medications on file prior to encounter.   Current Outpatient Medications on File Prior to Encounter  Medication Sig Dispense Refill   folic acid (FOLVITE) 1 MG tablet Take 1 tablet by mouth daily.     Adalimumab-adaz (HYRIMOZ) 40 MG/0.4ML SOAJ Inject 40 mg into the skin once a week.     metFORMIN (GLUCOPHAGE) 500 MG tablet TAKE 1 TABLET BY MOUTH 2 TIMES DAILY WITH A MEAL. (Patient not taking: Reported on 12/21/2023) 180 tablet 0   methotrexate (RHEUMATREX) 2.5 MG tablet Take by mouth.     Multiple Vitamin (MULTIVITAMIN) tablet Take 1 tablet by mouth daily.     oseltamivir (TAMIFLU) 75 MG capsule Take 1 capsule (75 mg total) by mouth daily. 10 capsule 0    Pertinent medications related to GI and procedure were reviewed by me with the patient prior to the procedure   Current Facility-Administered Medications:    0.9 %  sodium chloride infusion, , Intravenous, Continuous, Jaynie Collins, DO, Last Rate: 20 mL/hr at 12/21/23 0856, New Bag at 12/21/23 0856  sodium chloride 20 mL/hr at 12/21/23 1610       Allergies  Allergen Reactions   Amoxicillin-Pot Clavulanate     REACTION: nausea- but tolerates amoxil- this was verified with  mother 02/04/11   Allergies were reviewed by me prior to the procedure  Objective   Body mass index is 22.87 kg/m. Vitals:   12/21/23 0844  BP: (!) 143/96  Pulse: 84  Resp: 16  Temp: (!) 97 F (36.1 C)  TempSrc: Temporal  SpO2: 100%  Weight: 66.2 kg     Physical Exam Vitals and nursing note reviewed.  Constitutional:      General: She is not in acute distress.     Appearance: Normal appearance. She is not ill-appearing, toxic-appearing or diaphoretic.  HENT:     Head: Normocephalic and atraumatic.     Nose: Nose normal.     Mouth/Throat:     Mouth: Mucous membranes are moist.     Pharynx: Oropharynx is clear.  Eyes:     General: No scleral icterus.    Extraocular Movements: Extraocular movements intact.  Cardiovascular:     Rate and Rhythm: Normal rate and regular rhythm.     Heart sounds: Normal heart sounds. No murmur heard.    No friction rub. No gallop.  Pulmonary:     Effort: Pulmonary effort is normal. No respiratory distress.     Breath sounds: Normal breath sounds. No wheezing, rhonchi or rales.  Abdominal:     General: Bowel sounds are normal. There is no distension.     Palpations: Abdomen is soft.     Tenderness: There is abdominal tenderness (mild RLQ). There is no guarding or rebound.  Musculoskeletal:     Cervical back: Neck supple.     Right lower leg: No edema.     Left lower leg: No edema.  Skin:    General: Skin is warm and dry.     Coloration: Skin is not jaundiced or pale.  Neurological:     General: No focal deficit present.     Mental Status: She is alert and oriented to person, place, and time. Mental status is at baseline.  Psychiatric:        Mood and Affect: Mood normal.        Behavior: Behavior normal.        Thought Content: Thought content normal.        Judgment: Judgment normal.      Assessment:  [name] is a 17 y.o. female  who presents today for Esophagogastroduodenoscopy and Colonoscopy for Diarrhea, bright red blood per rectum, abdominal pain, weight loss .  Plan:  Esophagogastroduodenoscopy and Colonoscopy with possible intervention today  Esophagogastroduodenoscopy and Colonoscopy with possible biopsy, control of bleeding, polypectomy, and interventions as necessary has been discussed with the patient/patient representative. Informed consent was obtained from the  patient/patient representative after explaining the indication, nature, and risks of the procedure including but not limited to death, bleeding, perforation, missed neoplasm/lesions, cardiorespiratory compromise, and reaction to medications. Opportunity for questions was given and appropriate answers were provided. Patient/patient representative has verbalized understanding is amenable to undergoing the procedure.   Jaynie Collins, DO  [facility]  Portions of the record may have been created with voice recognition software. Occasional wrong-word or 'sound-a-like' substitutions may have occurred due to the inherent limitations of voice recognition software.  Read the chart carefully and recognize, using context, where substitutions may have occurred.

## 2023-12-22 ENCOUNTER — Encounter: Payer: Self-pay | Admitting: Gastroenterology

## 2023-12-22 LAB — SURGICAL PATHOLOGY

## 2024-01-02 IMAGING — US US EXTREM LOW VENOUS*L*
1 series · 14 of 24 positions shown · non-contrast
Comparison: None.

CLINICAL DATA: Calf pain.

EXAM:
LEFT LOWER EXTREMITY VENOUS DOPPLER ULTRASOUND
TECHNIQUE: Gray-scale sonography with compression, as well as color and duplex
ultrasound, were performed to evaluate the deep venous system(s)
from the level of the common femoral vein through the popliteal and
proximal calf veins.

[Series 1: us extrem low venous*left* · 14 of 31 slices shown]
[im 1/31]
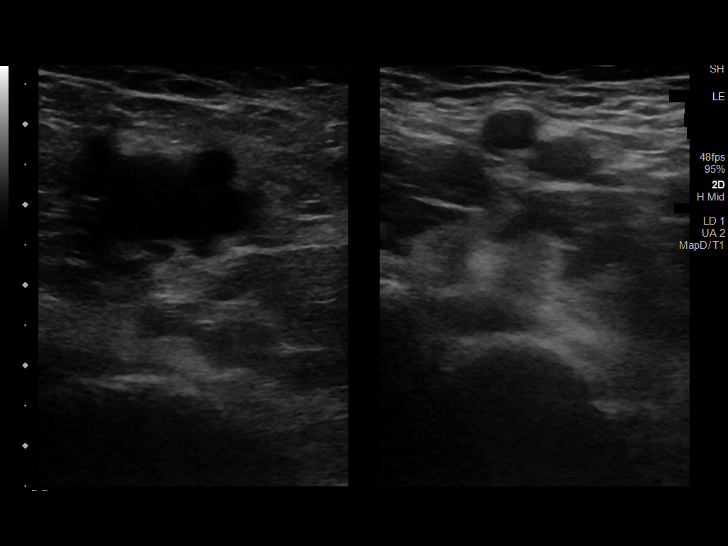
[im 3/31]
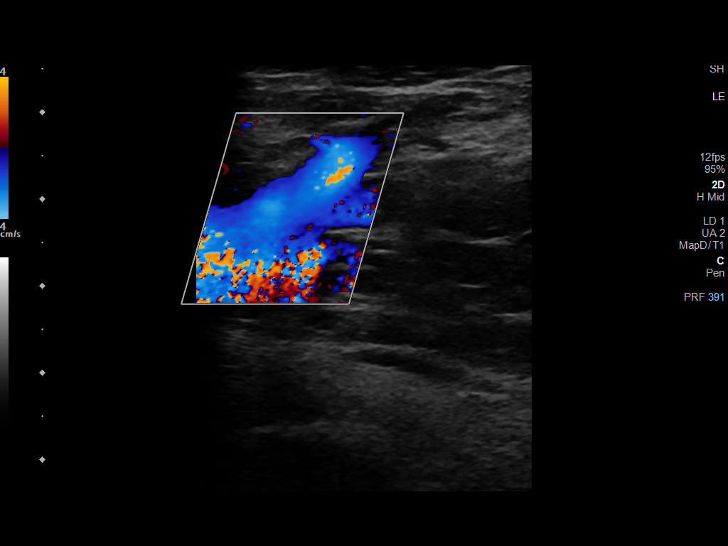
[im 6/31]
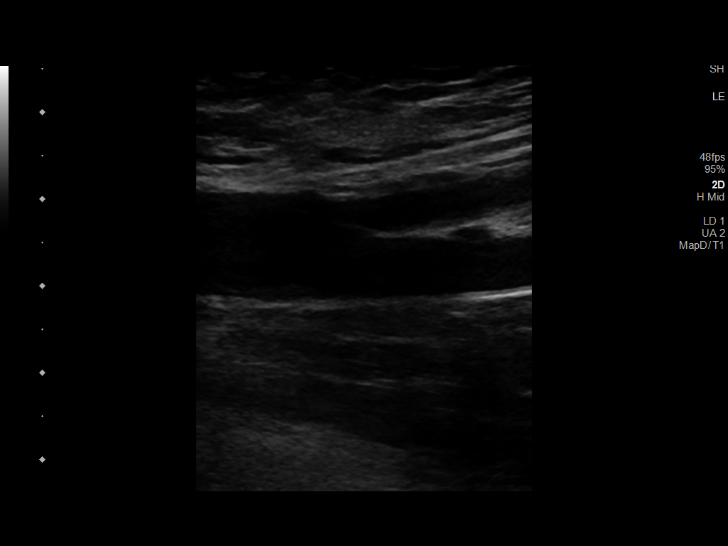
[im 8/31]
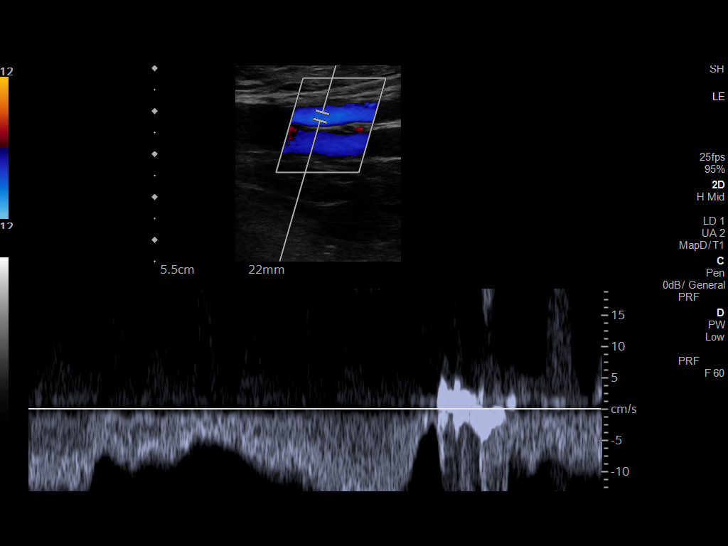
[im 10/31]
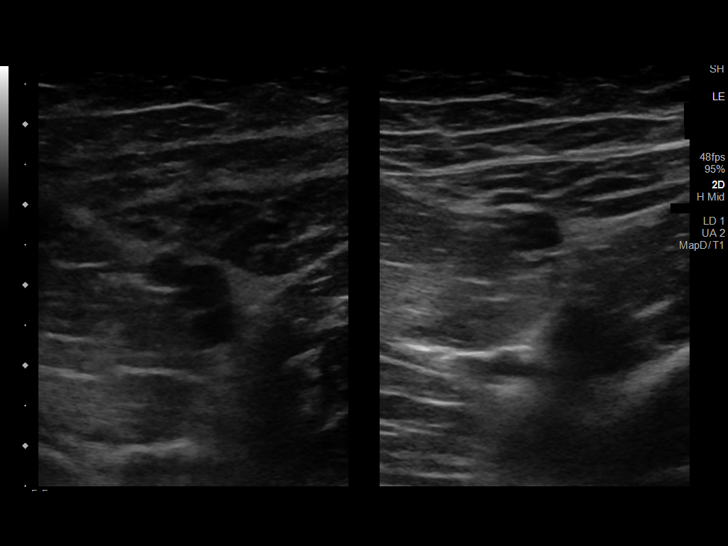
[im 12/31]
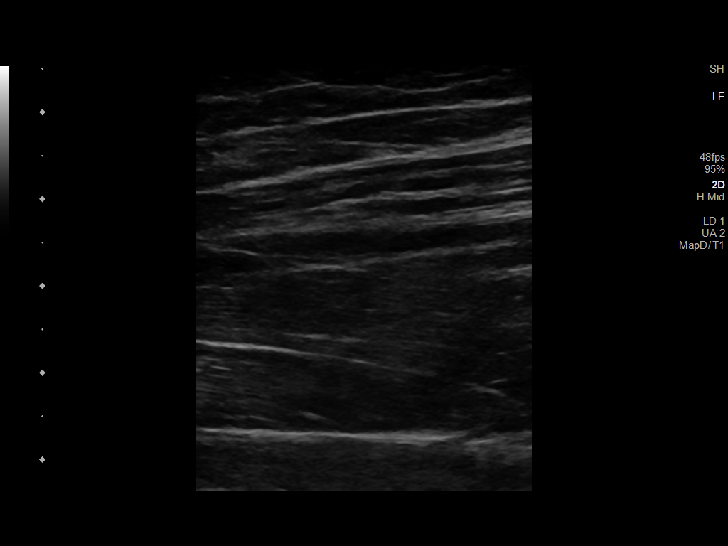
[im 15/31]
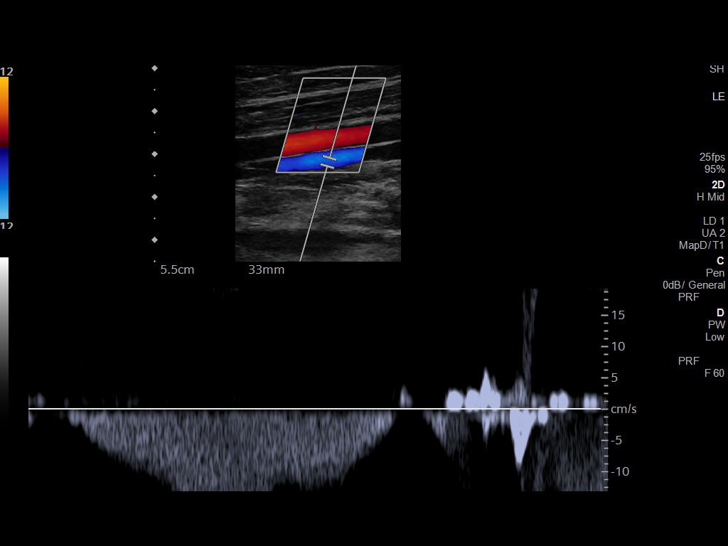
[im 16/31]
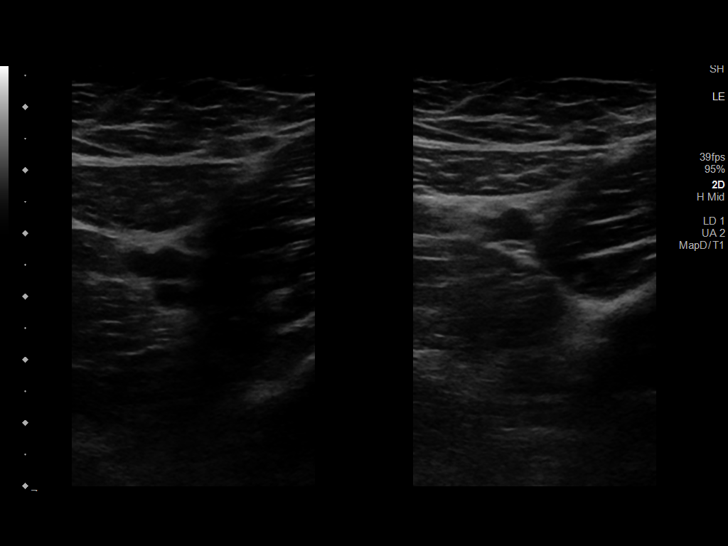
[im 19/31]
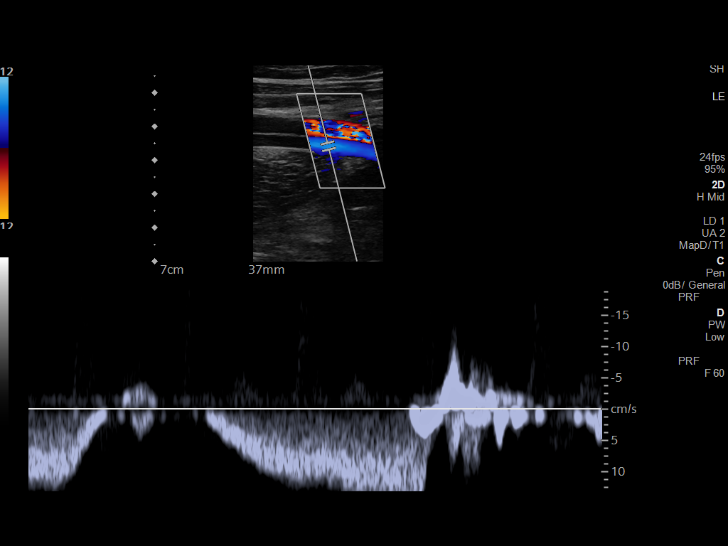
[im 21/31]
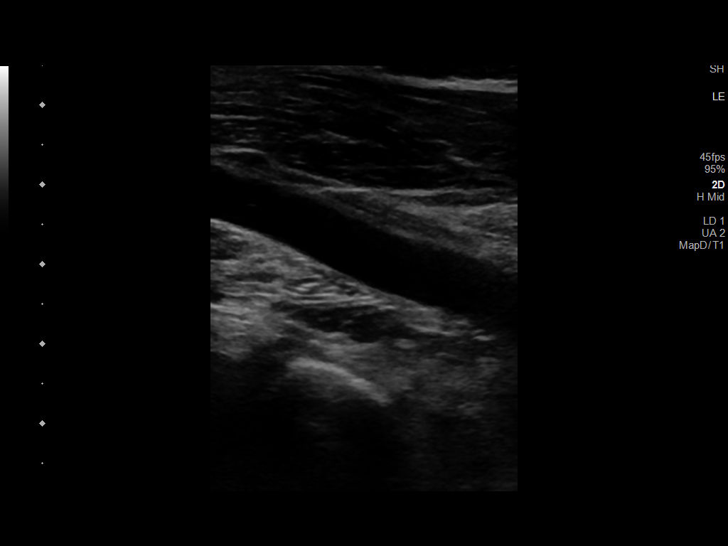
[im 24/31]
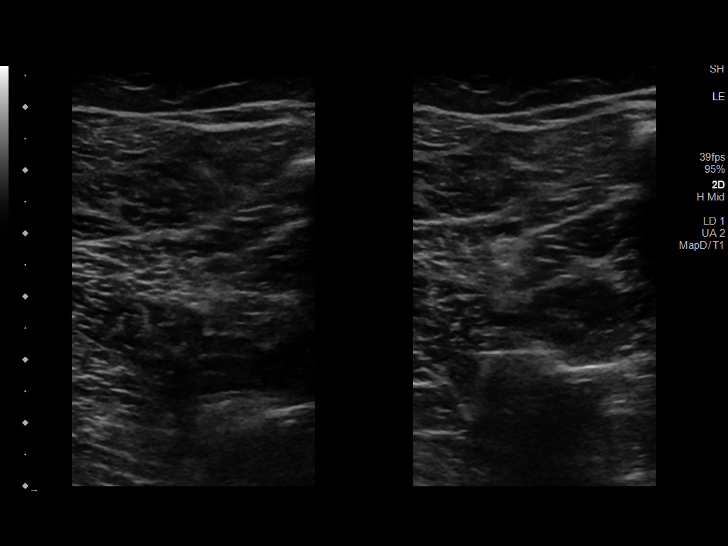
[im 25/31]
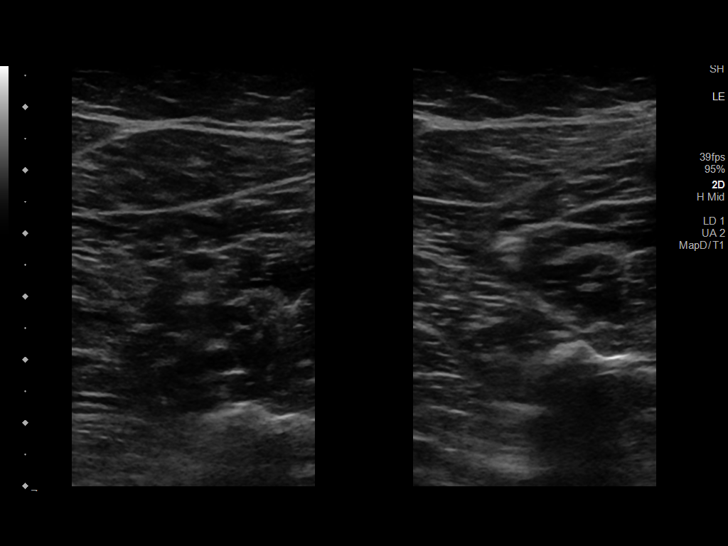
[im 28/31]
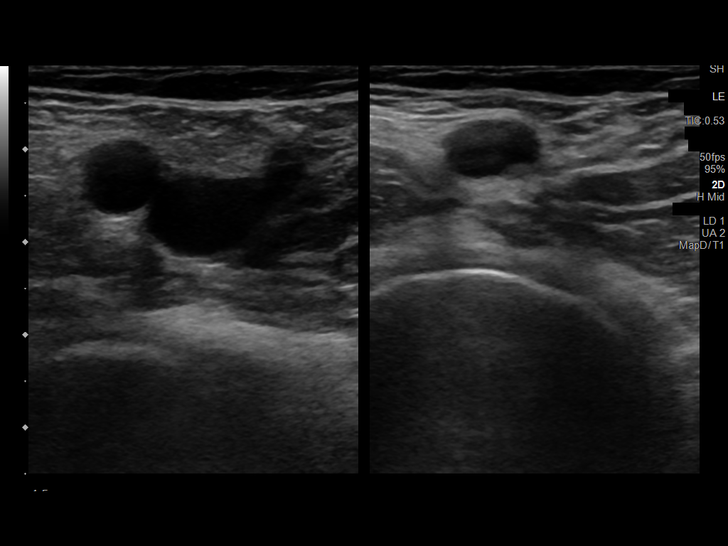
[im 31/31]
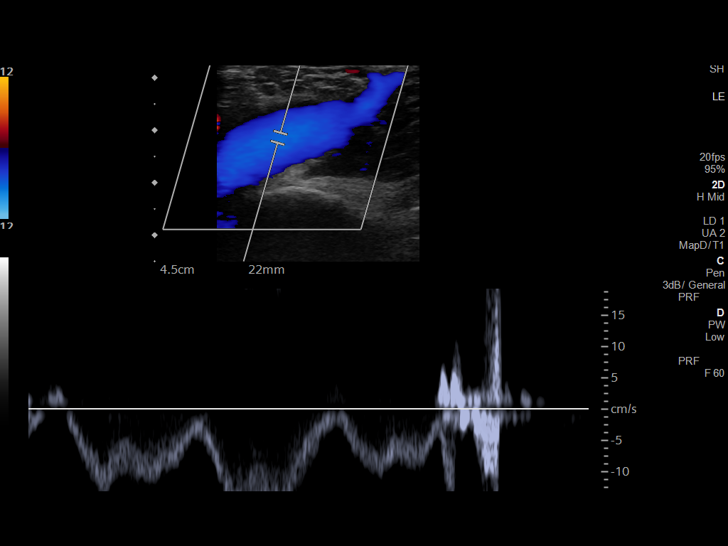

[14 of 24 positions shown; findings below may reference images not displayed]

FINDINGS: VENOUS

Normal compressibility of the common femoral, superficial femoral,
and popliteal veins, as well as the visualized calf veins.
Visualized portions of profunda femoral vein and great saphenous
vein unremarkable. No filling defects to suggest DVT on grayscale or
color Doppler imaging. Doppler waveforms show normal direction of
venous flow, normal respiratory plasticity and response to
augmentation.

Limited views of the contralateral common femoral vein are
unremarkable.

OTHER

None.

Limitations: none
IMPRESSION: No femoropopliteal DVT nor evidence of DVT within the visualized
calf veins.

## 2024-02-26 ENCOUNTER — Encounter: Payer: Self-pay | Admitting: Nurse Practitioner

## 2024-02-26 ENCOUNTER — Ambulatory Visit (INDEPENDENT_AMBULATORY_CARE_PROVIDER_SITE_OTHER): Payer: Self-pay | Admitting: Nurse Practitioner

## 2024-02-26 VITALS — BP 122/78 | HR 96 | Ht 66.0 in | Wt 146.0 lb

## 2024-02-26 DIAGNOSIS — Z01419 Encounter for gynecological examination (general) (routine) without abnormal findings: Secondary | ICD-10-CM

## 2024-02-26 DIAGNOSIS — Z1331 Encounter for screening for depression: Secondary | ICD-10-CM

## 2024-02-26 DIAGNOSIS — E288 Other ovarian dysfunction: Secondary | ICD-10-CM

## 2024-02-26 DIAGNOSIS — E282 Polycystic ovarian syndrome: Secondary | ICD-10-CM | POA: Diagnosis not present

## 2024-02-26 MED ORDER — SPIRONOLACTONE 50 MG PO TABS: 50.0000 mg | ORAL_TABLET | Freq: Every day | ORAL | 1 refills | Status: DC

## 2024-02-26 NOTE — Progress Notes (Addendum)
   Michelle Snow 2006/06/27 [id number]   History:  18 y.o. G0 presents for annual exam. H/O irregular periods but they have been more regular. H/O PCOS, low TSH. Stopped Metformin  because she did not notice a difference. Her biggest symptoms are facial hair and acne. Interested in spironolactone . Saw endocrinology for low TSH, who has decided not to start medication unless T3 and/or T4 become abnormal. Being followed by PCP now. Strong family history of hyperthyroidism. Not interested in hormonal contraception due to risk of worsening lipedema. Normal pelvic ultrasound 04/2023.  Declines Gardasil. H/O juvenile rheumatoid arthritis - on methotrexate and adalimumab.   Gynecologic History Patient's last menstrual period was 01/30/2024 (approximate). Period Cycle (Days):  (28-30) Period Duration (Days): 7-10 Menstrual Flow: Heavy Menstrual Control: Maxi pad, Tampon Dysmenorrhea: (!) Severe Dysmenorrhea Symptoms: Cramping, Nausea, Headache Contraception/Family planning: condoms Sexually active: Yes, declines STD screening  Health Maintenance Last Pap: Not indicated Last mammogram: Not indicated Last colonoscopy: 12/21/2023. Results were: Normal Last Dexa: Not indicated     02/26/2024    3:38 PM  Depression screen PHQ 2/9  Decreased Interest 2  Down, Depressed, Hopeless 1  PHQ - 2 Score 3  Altered sleeping 3  Tired, decreased energy 2  Change in appetite 1  Feeling bad or failure about yourself  0  Trouble concentrating 0  Moving slowly or fidgety/restless 0  Suicidal thoughts 0  PHQ-9 Score 9  Difficult doing work/chores Somewhat difficult     Past medical history, past surgical history, family history and social history were all reviewed and documented in the EPIC chart. 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 Cheering. History of competitive dance.   ROS:  A ROS was performed and pertinent positives and negatives are included.  Exam:  Vitals:   02/26/24  1540  BP: 122/78  Pulse: 96  SpO2: 99%  Weight: 146 lb (66.2 kg)  Height: 5\' 6"  (1.676 m)     Body mass index is 23.57 kg/m.  General appearance:  Normal Thyroid :  Symmetrical, normal in size, without palpable masses or nodularity. Respiratory  Auscultation:  Clear without wheezing or rhonchi Cardiovascular  Auscultation:  Regular rate, without rubs, murmurs or gallops  Edema/varicosities:  Not grossly evident Abdominal  Soft,nontender, without masses, guarding or rebound.  Liver/spleen:  No organomegaly noted  Hernia:  None appreciated  Skin  Inspection:  Grossly normal Breasts: Not indicated per guidelines Pelvic: Not indicated  Assessment/Plan:  18 y.o. G0 for annual exam.   Well female exam without gynecological exam - Education provided on SBEs, importance of preventative screenings, current guidelines, high calcium diet, regular exercise, and multivitamin daily. Labs with PCP.   PCOS (polycystic ovarian syndrome) - Plan: spironolactone  (ALDACTONE ) 50 MG tablet daily. Cycles have been more regular lately. Stopped Metformin . Not interested in hormonal contraception. Androgen symptoms are her biggest concern.   Hyperandrogenism - Plan: spironolactone  (ALDACTONE ) 50 MG tablet daily. Aware of diuretic properties and S/S of low blood pressure. Educated on risk of severe birth defects with use. Using condoms. He is stationed in [place] right now, so not currently sexually active.   Return in 1 year for annual or sooner if needed.      Andee Bamberger DNP, 9:19 AM 02/27/2024

## 2024-02-27 NOTE — Addendum Note (Signed)
 Addended byAntonio Klinefelter on: 02/27/2024 09:19 AM   Modules accepted: Orders

## 2024-03-20 ENCOUNTER — Other Ambulatory Visit: Payer: Self-pay | Admitting: Nurse Practitioner

## 2024-03-20 DIAGNOSIS — E282 Polycystic ovarian syndrome: Secondary | ICD-10-CM

## 2024-03-20 DIAGNOSIS — E288 Other ovarian dysfunction: Secondary | ICD-10-CM

## 2024-03-20 NOTE — Telephone Encounter (Signed)
 Med refill request: spironolactone  50 mg, pharmacy requests a 90 day supply Last AEX: 05/19/225 Next AEX: none scheduled Last MMG (if hormonal med) n/a Last filled 02/26/24 #30  Refill authorized: spironolactone  50 mg. Please approve or deny as appropriate.

## 2024-05-29 ENCOUNTER — Ambulatory Visit: Admitting: Nurse Practitioner

## 2024-06-12 ENCOUNTER — Encounter: Payer: Self-pay | Admitting: Nurse Practitioner

## 2024-06-12 ENCOUNTER — Ambulatory Visit: Admitting: Nurse Practitioner

## 2024-06-12 VITALS — BP 104/56 | HR 95 | Wt 143.0 lb

## 2024-06-12 DIAGNOSIS — E288 Other ovarian dysfunction: Secondary | ICD-10-CM | POA: Diagnosis not present

## 2024-06-12 DIAGNOSIS — E282 Polycystic ovarian syndrome: Secondary | ICD-10-CM

## 2024-06-12 MED ORDER — SPIRONOLACTONE 100 MG PO TABS: 100.0000 mg | ORAL_TABLET | Freq: Every day | ORAL | 2 refills | Status: AC

## 2024-06-12 NOTE — Progress Notes (Signed)
   Acute Office Visit  Subjective:    Patient ID: Michelle Snow, female    DOB: [birth year], 18 y.o.   MRN: [id number]   HPI 18 y.o. presents today medication follow up. Started spironolactone  in May for hyperandrogenism s/t PCOS. Has noticed improvement but would like to increase dose. Not interested in hormonal contraception due to risk of worsening lipedema. Stopped Metformin  because she did not notice a difference. Cycles are regular.   Patient's last menstrual period was 05/21/2024 (exact date). Period Duration (Days): 7 Period Pattern: Regular Menstrual Flow: Heavy Menstrual Control: Maxi pad, Tampon Dysmenorrhea: (!) Mild Dysmenorrhea Symptoms: Nausea, Headache  Review of Systems  Constitutional: Negative.   Genitourinary: Negative.   Skin:        Facial hair, acne       Objective:    Physical Exam Constitutional:      Appearance: Normal appearance.   GU: Not indicated  BP (!) 104/56 (BP Location: Left Arm, Patient Position: Sitting, Cuff Size: Normal)   Pulse 95   Wt 143 lb (64.9 kg)   LMP 05/21/2024 (Exact Date)   SpO2 98%   BMI 23.08 kg/m  Wt Readings from Last 3 Encounters:  06/12/24 143 lb (64.9 kg)  02/26/24 146 lb (66.2 kg) (76%, Z= 0.71)*  12/21/23 146 lb (66.2 kg) (76%, Z= 0.72)*   * Growth percentiles are based on CDC (Girls, 2-20 Years) data.        Assessment & Plan:   Problem List Items Addressed This Visit       Endocrine   PCOS (polycystic ovarian syndrome)   Other Visit Diagnoses       Hyperandrogenism    -  Primary   Relevant Medications   spironolactone  (ALDACTONE ) 100 MG tablet      Plan: Increase spironolactone  to 100 mg daily. Aware of possible decrease in blood pressure and risks for birth defects with pregnancy. Contraception recommended.   Return if symptoms worsen or fail to improve.    Michelle DELENA Shutter DNP, 11:33 AM 06/12/2024

## 2024-06-19 ENCOUNTER — Telehealth: Payer: Self-pay | Admitting: Internal Medicine

## 2024-06-19 NOTE — Telephone Encounter (Signed)
 Hi there, this is [place] from [place] at [facility]. I am returning your message back about wanting to request medical records, in order to do that you would need to call the [facility] medical records [phone]

## 2024-06-24 ENCOUNTER — Encounter: Payer: Self-pay | Admitting: Nurse Practitioner

## 2024-06-24 NOTE — Telephone Encounter (Signed)
Routing to Dr.Silva for review. 

## 2024-06-24 NOTE — Telephone Encounter (Signed)
 Left message to call [facility] Triage at [phone], option 4.

## 2024-07-01 ENCOUNTER — Emergency Department
Admission: EM | Admit: 2024-07-01 | Discharge: 2024-07-01 | Disposition: A | Discharge status: Home / Self Care | Source: Home / Self Care

## 2024-07-01 NOTE — Progress Notes (Signed)
 Patient presents with hematoma subsequent to venipuncture 3 days prior.  While waiting, patient reports fingertips are turning blue and numb.  To ED for further evaluation and care.

## 2024-07-18 ENCOUNTER — Ambulatory Visit: Admitting: Nurse Practitioner

## 2024-07-19 ENCOUNTER — Ambulatory Visit: Admitting: Nurse Practitioner

## 2024-07-19 ENCOUNTER — Telehealth: Payer: Self-pay

## 2024-07-19 VITALS — BP 110/70 | HR 80 | Ht 65.75 in | Wt 145.6 lb

## 2024-07-19 DIAGNOSIS — E282 Polycystic ovarian syndrome: Secondary | ICD-10-CM | POA: Diagnosis not present

## 2024-07-19 DIAGNOSIS — E162 Hypoglycemia, unspecified: Secondary | ICD-10-CM | POA: Diagnosis not present

## 2024-07-19 MED ORDER — NORETHINDRONE 0.35 MG PO TABS: 1.0000 | ORAL_TABLET | Freq: Every day | ORAL | 0 refills | Status: DC

## 2024-07-19 NOTE — Telephone Encounter (Signed)
 I spoke to patient and she said she was concerned because she is having the heavy bleeding & was referred to hematology due to bruising easy. Patient states she does not take any NSAIDS. Please advise on recommendations.

## 2024-07-19 NOTE — Telephone Encounter (Signed)
 Patient notified of recommendations.

## 2024-07-19 NOTE — Telephone Encounter (Signed)
 Started menses yesterday so bleeding expected. Start birth control that was ordered today. This should help with bleeding. Can do Ibuprofen 600 mg every 8 hours to help slow bleeding.

## 2024-07-19 NOTE — Progress Notes (Signed)
   Acute Office Visit  Subjective:    Patient ID: Michelle Snow, female    DOB: 10-05-2006, 18 y.o.   MRN: [id number]   HPI 18 y.o. presents today for PCOS management. Has been hesitant to start hormonal contraception due to risk of worsening lipedema but she is ready to try. She has spoken to her vascular specialist and he agrees to starting birth control. Significant family history of cancer, mother with ER+ breast cancer. Patient wants to avoid estrogen. Not sexually active. Normal pelvic ultrasound 04/2023. Doing spironolactone  for androgen symptoms. Did Metformin  for a short time but did not feel it was helping. Thinks she has had ovarian cysts in the past based on symptoms. Most recent was 2 weeks ago when she had right sided pain. Menses started shortly after. H/O hyperthyroidism - Saw pediatric endocrinology for a while, did not start medication because T3 and T4 are normal. Has been checking blood sugars and readings were in 50s-60s multiple times per day even after eating. Wore a continuous monitor for a week. Does have lightheadedness at times. Rheumatologist referred her to hematology for bruising to rule out bleeding disorders.   Patient's last menstrual period was 07/18/2024 (exact date). Period Duration (Days): 7-8 Period Pattern: (!) Irregular Menstrual Flow: Heavy Menstrual Control: Tampon, Maxi pad Dysmenorrhea: (!) Mild Dysmenorrhea Symptoms: Cramping, Throbbing, Nausea, Diarrhea, Headache  Review of Systems  Constitutional: Negative.   Genitourinary:  Positive for menstrual problem.       Objective:    Physical Exam Constitutional:      Appearance: Normal appearance.   GU: Not indicated  BP 110/70   Pulse 80   Ht 5' 5.75 (1.67 m)   Wt 145 lb 9.6 oz (66 kg)   LMP 07/18/2024 (Exact Date)   SpO2 97%   BMI 23.68 kg/m  Wt Readings from Last 3 Encounters:  07/19/24 145 lb 9.6 oz (66 kg)  06/12/24 143 lb (64.9 kg)  02/26/24 146 lb (66.2 kg) (76%, Z= 0.71)*    * Growth percentiles are based on CDC (Girls, 2-20 Years) data.        Assessment & Plan:   Problem List Items Addressed This Visit       Endocrine   PCOS (polycystic ovarian syndrome) - Primary   Relevant Medications   norethindrone (MICRONOR) 0.35 MG tablet   Other Visit Diagnoses       Hypoglycemia       Relevant Orders   Ambulatory referral to Endocrinology      Plan: Discussed progestin-only options to include POPs, Nexplanon, IUD and Depo. Wants to do POPs. Educated on proper use. Recommend Inositol. Will send referral to endo for evaluation of low blood sugar.   Return if symptoms worsen or fail to improve.    Michelle DELENA Shutter DNP, 11:26 AM 07/19/2024

## 2024-07-19 NOTE — Telephone Encounter (Signed)
 Patient called and left a message on triage voicemail. She stated she had an appointment with Annabella, NP earlier today. She said when she went to the restroom she was bleeding heavy. Her tampon was still end & blood was going into the toilet. She wanted to get suggestion from [place] regarding it. I called her back to get more information. No voicemail set up.

## 2024-07-22 ENCOUNTER — Ambulatory Visit: Payer: Self-pay

## 2024-07-22 ENCOUNTER — Other Ambulatory Visit: Payer: Self-pay | Admitting: Nurse Practitioner

## 2024-07-22 DIAGNOSIS — E162 Hypoglycemia, unspecified: Secondary | ICD-10-CM

## 2024-07-22 MED ORDER — LANCET DEVICE MISC: 1.0000 | Freq: Every day | 0 refills | Status: DC

## 2024-07-22 MED ORDER — BLOOD GLUCOSE TEST VI STRP: 1.0000 | ORAL_STRIP | Freq: Every day | 1 refills | Status: DC

## 2024-07-22 MED ORDER — LANCETS MISC. MISC: 1.0000 | Freq: Every day | 1 refills | Status: DC

## 2024-07-22 MED ORDER — BLOOD GLUCOSE MONITORING SUPPL DEVI: 1.0000 | Freq: Every day | 0 refills | Status: AC

## 2024-07-22 NOTE — Telephone Encounter (Signed)
 FYI Only or Action Required?: FYI only for provider.  Patient was last seen in primary care on 11/20/2023 by Alphonsa Glendia LABOR, MD.  Called Nurse Triage reporting Hypoglycemia.  Symptoms began several years ago.  Interventions attempted: Other: metformin , current referral to endocrinology, monitoring, meals .  Symptoms are: stable.  Triage Disposition: Home Care  Patient/caregiver understands and will follow disposition?: Yes  Copied from CRM [id number]. Topic: Clinical - Red Word Triage >> Jul 22, 2024 12:37 PM Carlyon D wrote: Red Word that prompted transfer to Nurse Triage: PT has low blood sugar and mother needs to get daughter the meter ordered by pcp Reason for Disposition  [1] Blood glucose 70 mg/dL (3.9 mmol/L) or below, OR symptomatic AND [2] cause known  Answer Assessment - Initial Assessment Questions Calling for RX for glucometer and supplies Patient has been referred to endocrinology, but has been instructed to contact PCP for RX for glucose monitoring supplies  1. SYMPTOMS: What symptoms are you concerned about?     Low blood sugar, patient with PCOS 2. ONSET:  When did the symptoms start?     A few months, but has been going on for years 3. BLOOD GLUCOSE: What is your blood glucose level?      Parent states that blood sugars have been dropping into 50's and 60's at times 4. USUAL RANGE: What is your blood glucose level usually? (e.g., usual fasting morning value, usual evening value)     100 5. TYPE 1 or 2:  Do you know what type of diabetes you have?  (e.g., Type 1, Type 2, Gestational; doesn't know)      Not diabetic 6. INSULIN: Do you take insulin? What type of insulin(s) do you use? What is the mode of delivery? (syringe, pen; injection or pump) When did you last give yourself an insulin dose? (i.e., time or hours/minutes ago) How much did you give? (i.e., how many units)     N/a 7. DIABETES PILLS: Do you take any pills for your diabetes? If Yes,  ask: What is the name of the medicine(s) that you take for high blood sugar?     Was on metformin  for PCOS, but is no longer taking 8. OTHER SYMPTOMS: Do you have any symptoms? (e.g., fever, frequent urination, difficulty breathing, vomiting)     denies 9. LOW BLOOD GLUCOSE TREATMENT: What have you done so far to treat the low blood glucose level?     Eats a regular meal, then blood spikes to 170's to 180's and she does not feel well 10. FOOD: When did you last eat or drink?       N/a 11. ALONE: Are you alone right now or is someone with you?        Patient is at work and mother is calling on her behalf 38. PREGNANCY: Is there any chance you are pregnant? When was your last menstrual period?       Unsure  Protocols used: Diabetes - Low Blood Sugar-A-AH

## 2024-07-22 NOTE — Telephone Encounter (Signed)
 Blood glucose supplies sent to pharmacy on file

## 2024-07-24 ENCOUNTER — Ambulatory Visit: Admitting: Nurse Practitioner

## 2024-07-24 VITALS — BP 132/80 | HR 76 | Temp 99.1°F | Ht 65.75 in | Wt 150.8 lb

## 2024-07-24 DIAGNOSIS — E162 Hypoglycemia, unspecified: Secondary | ICD-10-CM | POA: Diagnosis not present

## 2024-07-24 MED ORDER — FREESTYLE LIBRE 3 PLUS SENSOR MISC: 1 refills | Status: AC

## 2024-07-24 NOTE — Patient Instructions (Signed)
 Nice to see you today  Follow up with Endocrine I have sent in 6 months worth of glucose sensors Follow up in around 6ish months to establish care with a provider here

## 2024-07-24 NOTE — Progress Notes (Signed)
 Established Patient Office Visit  Subjective   Patient ID: Michelle Snow, female    DOB: 07/11/2006  Age: 18 y.o. MRN: [id number]  Chief Complaint  Patient presents with   Blood sugar monitoring    Pt complains of being in need of a ongoing prescription for freestyle libre. The current one expires in 3 days.     HPI  Discussed the use of AI scribe software for clinical note transcription with the patient, who gave verbal consent to proceed.  History of Present Illness Michelle Snow is a 18 year old female who presents with frequent episodes of hypoglycemia.  She has a long-standing history of hypoglycemia, with the first episode occurring at age 36. Over the past five to six years, she has experienced frequent episodes of low blood sugar, leading to syncope. Her blood sugar can drop to the fifties or sixties, and she becomes symptomatic when it reaches the fifties, experiencing muffled hearing, and loses consciousness when it drops to the forties. These episodes occur after eating, exercising, and during sleep. She uses a sensor that alerts her to low blood sugar five to seven times a day.  She manages her hypoglycemia by monitoring her blood sugar levels, which remain low even after consuming candy. Dietary modifications, such as eating protein with carbohydrates, have not been effective. Her blood sugar levels fluctuate significantly, resembling an EKG reading.  She has a history of polycystic ovary syndrome (PCOS) and endometriosis, with three cyst ruptures on the right side. 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 Recently, she started spironolactone  100 mg daily and progesterone-based birth control to manage uterine bleeding and endometrial tissue shedding. She chose progesterone due to her mother's history of estrogen-driven cancer.  She has subclinical hyperthyroidism, with low TSH levels since age 48, but  normal T3 and T4 levels. She has not been treated with medication but monitors her thyroid  function annually with her rheumatologist.  She has a significant family history of cancer, with her mother having had estrogen-driven cancer and a family history of cance. 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  She is currently taking spironolactone , Micronor, folic acid, and Hyrimoz. She was previously on Humira for eight years but switched to Hyrimoz due to insurance changes. She is awaiting insurance approval to switch to Rinvoq, a non-TNF blocker, to reduce her cancer risk.     Review of Systems  Constitutional:  Negative for chills and fever.  Respiratory:  Negative for shortness of breath.   Cardiovascular:  Negative for chest pain.  Neurological:  Negative for headaches.  Psychiatric/Behavioral:  Negative for hallucinations and suicidal ideas.       Objective:     BP 132/80   Pulse 76   Temp 99.1 F (37.3 C) (Oral)   Ht 5' 5.75 (1.67 m)   Wt 150 lb 12.8 oz (68.4 kg)   LMP 07/18/2024 (Exact Date)   SpO2 99%   BMI 24.53 kg/m  BP Readings from Last 3 Encounters:  07/24/24 132/80  07/19/24 110/70  06/12/24 (!) 104/56   Wt Readings from Last 3 Encounters:  07/24/24 150 lb 12.8 oz (68.4 kg)  07/19/24 145 lb 9.6 oz (66 kg)  06/12/24 143 lb (64.9 kg)   SpO2 Readings from Last 3 Encounters:  07/24/24 99%  07/19/24 97%  06/12/24 98%      Physical Exam Vitals  and nursing note reviewed.  Constitutional:      Appearance: Normal appearance.  Cardiovascular:     Rate and Rhythm: Normal rate and regular rhythm.     Heart sounds: Normal heart sounds.  Pulmonary:     Effort: Pulmonary effort is normal.     Breath sounds: Normal breath sounds.  Neurological:     Mental Status: She is alert.      No results found for any visits on 07/24/24.    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 Continuous glucose monitoring shows frequent low blood glucose alarms. Awaiting endocrinology consultation. - Prescribed Freestyle Libre for continuous glucose monitoring with a 32-month supply. - Await endocrinology consultation for further evaluation.  Melanoma Melanoma with recent recurrence. High familial cancer risk noted. - Schedule dermatology follow-up for melanoma management.  Polycystic ovary syndrome (PCOS) PCOS managed with spironolactone  and progesterone-only birth control due to family history of estrogen-driven cancer. - Continue spironolactone  100 mg daily. - Continue progesterone-only birth control.  Endometriosis Endometriosis managed with progesterone-only birth control. - Continue progesterone-only birth control.  Lipedema Lipedema managed.  Subclinical hyperthyroidism Subclinical hyperthyroidism with low TSH but normal T3 and T4 levels. No treatment required as per previous endocrinology advice. - Continue annual monitoring of thyroid  function by rheumatologist.  Return in about 6 months (around 01/22/2025) for CPE and Labs.    Michelle Crandall, NP

## 2024-08-12 ENCOUNTER — Ambulatory Visit: Payer: Self-pay | Admitting: Internal Medicine

## 2024-08-12 NOTE — Telephone Encounter (Signed)
 This RN made first attempt to contact pt with the following response:  The person you are trying to reach has a voicemail box that has not been set up yet. Please try your call again later.     Copied from CRM [id number]. Topic: Clinical - Red Word Triage >> Aug 12, 2024  8:47 AM Michelle Snow wrote: Red Word that prompted transfer to Nurse Triage: low sugar(30 previously), sensor went off repeated last night due to low reading. Previous  prescribed Freestyle libre in Oct from provider [place]. Endo appt not until January, doesn't believe she can wait that long

## 2024-08-12 NOTE — Telephone Encounter (Signed)
 FYI Only or Action Required?: Action required by provider: clinical question for provider and update on patient condition.  Patient was last seen in primary care on 07/24/2024 by Wendee Lynwood HERO, NP.  Called Nurse Triage reporting Hypoglycemia.  Symptoms began several days ago.  Interventions attempted: Dietary changes.  Symptoms are: stable.  Triage Disposition: Call PCP Within 24 Hours  Patient/caregiver understands and will follow disposition?: Yes                          Reason for Disposition  [1] Blood glucose 70 mg/dL (3.9 mmol/L) or below OR symptomatic, now improved with Care Advice AND [2] cause unknown  Answer Assessment - Initial Assessment Questions 1. SYMPTOMS: What symptoms are you concerned about?     States she is not experiencing symptoms at this time, but experiences shakiness, headaches, sweating, tinnitus and blurred vision when blood glucose drops  2. ONSET:  When did the symptoms start?     States she has had issues since she was 18 years old from passing out due to low blood glucose, states blood sugar dipped into the 30s on Thursday night and sensor has been going off several times since then, states sensor went off more than 20 times yesterday  3. BLOOD GLUCOSE: What is your blood glucose level?      82 at this time, states sensor has alerted twice today  4. USUAL RANGE: What is your blood glucose level usually? (e.g., usual fasting morning value, usual evening value)     150 when eating, drops down to 52 within 30 minutes  5. TYPE 1 or 2:  Do you know what type of diabetes you have?  (e.g., Type 1, Type 2, Gestational; doesn't know)      Unsure 6. INSULIN: Do you take insulin? What type of insulin(s) do you use? What is the mode of delivery? (syringe, pen; injection or pump) When did you last give yourself an insulin dose? (i.e., time or hours/minutes ago) How much did you give? (i.e., how many units)     Denies 7.  DIABETES PILLS: Do you take any pills for your diabetes? If Yes, ask: What is the name of the medicine(s) that you take for high blood sugar?     Denies  8. OTHER SYMPTOMS: Do you have any symptoms? (e.g., fever, frequent urination, difficulty breathing, vomiting)     Denies at this time 9. LOW BLOOD GLUCOSE TREATMENT: What have you done so far to treat the low blood glucose level?     States she manages blood glucose with food and glucose tablets only  10. FOOD: When did you last eat or drink?     This morning around 7 am 11. ALONE: Are you alone right now or is someone with you?      States she is at work 12. PREGNANCY: Is there any chance you are pregnant? When was your last menstrual period?     N/A    Patient was seen in office on 07/24/24 and referred to endocrinology. Patient stated she cannot be seen until 10/31/24. Patient stated she has been experiencing extremely low blood glucose several times since Thursday of last week. Patient stated her freestyle libre sensor has been altering her several times. Patient is stable at this time. Patient is not on medication for blood glucose issues and is managing symptoms with diet and glucose tablets only. Patient expressed concern about having to wait that long to  see endocrinology. Patient wanted to make PCP aware of her recent low blood glucose readings and sensor alerts. Patient is seeking provider's advice on additional management at this time.  Protocols used: Diabetes - Low Blood Sugar-A-AH

## 2024-08-12 NOTE — Telephone Encounter (Signed)
 She can call and alert the endo of her symptoms and see if they have any sooner appointments  She needs to eat frequently ( every couple of hours ) with small protein pack foods.

## 2024-08-13 ENCOUNTER — Encounter: Payer: Self-pay | Admitting: "Endocrinology

## 2024-08-13 ENCOUNTER — Ambulatory Visit: Admitting: "Endocrinology

## 2024-08-13 VITALS — BP 120/70 | HR 74 | Ht 65.75 in | Wt 150.0 lb

## 2024-08-13 DIAGNOSIS — E162 Hypoglycemia, unspecified: Secondary | ICD-10-CM

## 2024-08-13 DIAGNOSIS — E0590 Thyrotoxicosis, unspecified without thyrotoxic crisis or storm: Secondary | ICD-10-CM

## 2024-08-13 LAB — POCT GLYCOSYLATED HEMOGLOBIN (HGB A1C): Hemoglobin A1C: 5.1 % (ref 4.0–5.6)

## 2024-08-13 NOTE — Progress Notes (Signed)
 Outpatient Endocrinology Note Michelle Birmingham, MD    Michelle Snow 2005/10/26 [id number]  Referring Provider: Prentiss Annabella DELENA, NP Primary Care Provider: Jimmy Charlie FERNS, MD Reason for consultation: Subjective   Assessment & Plan  Diagnoses and all orders for this visit:  Hypoglycemia -     POCT glycosylated hemoglobin (Hb A1C) -     Sulfonylurea Hypoglycemics Panel, blood -     Basic metabolic panel with GFR -     Insulin, random -     Insulin antibodies, blood -     C-peptide -     Beta-hydroxybutyric acid -     Proinsulin -     Cortisol -     Glucagon -     Insulin-like growth factor -     Ambulatory referral to diabetic education  Subclinical hyperthyroidism   Reports passing out episodes since age of 31 but blood sugar was not checked 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, 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 per patient Reports history of hypoglycemia and mother all her life but she does not pass out anymore, patient reports hypoglycemia which may or may not be corrected by eating sugar Low blood sugar episodes can happen fasting/before meals/after meals Ordered baseline 8 AM blood work Instructed patient to rely more on her glucose meter other than CGM as she is asymptomatic most of the time when the CGM is reporting low: which questions the CGM's accuracy of hypoglycemia Recommend complex carbs every 3-4 hours along with protein, avoid simple carbs Recommend 1 spoonful of cornstarch with cottage cheese/juice twice daily to help with hypoglycemia  Ordered and discussed the case with diabetes educator/nutritionist Recommend patient to chart all her meals and her food at discussed further with the diet educator and with me   History of subclinical hyperthyroidism, which may be patient's baseline with around TSH of 0.2 with a normal free T4 Not on any thyroid  medicatio No history of  arrhythmia/osteoporosis Continue monitoring  Return in about 6 weeks (around 09/24/2024).   I have reviewed current medications, nurse's notes, allergies, vital signs, past medical and surgical history, family medical history, and social history for this encounter. Counseled patient on symptoms, examination findings, lab findings, imaging results, treatment decisions and monitoring and prognosis. The patient understood the recommendations and agrees with the treatment plan. All questions regarding treatment plan were fully answered.  Michelle Birmingham, MD  08/13/24   History of Present Illness HPI  Michelle Snow is a 18 y.o. year old female who presents for evaluation of hypoglycemia.  Reports seeing endocrinologist for history of subclinical hyperthyroidism and hypoglycemia Reports passing out since age 62 due to hypoglycemia  The timing of onset of symptoms relative to the time of meal ingestion is before/ after the meal or fasting   + fatigue, shaky, sweaty, head aches, blurry vision, difficulty concentration - sometimes improved by  Eats 3 meals a day, with protein in each meal Not losing weight intentionally, no fever, no drenching night sweats  -The patient's medication and drug history was reviewed carefully for potential causes of hypoglycemia. -No history of insulin usage or ingestion of an oral hypoglycemic agent -No history of diabetes mellitus, -No history of renal insufficiency/failure, -No history of alcoholism -No history of  hepatic cirrhosis/failure, -No history of other endocrine diseases -History of total gastrectomy + for stomach cancer -History of cardiac arrythmia  -No history of endocrine disorders (eg, pheochromocytoma, Addison disease,  glucagon deficiency, carcinomas, extrahepatic tumors) -No history of substance abuse (eg, cocaine, ethanol, salicylates, beta-blockers, pentamidine) -No history of nutritional disorders (eg, prolonged starvation before  anesthesia, protein calorie malnutrition, L-leucine-sensitive hypoglycemic defect in children, low-calorie ketogenic diet, renal disease)  CGM interpretation: At today's visit, we reviewed her CGM downloads. The full report is scanned in the media. Reviewing the CGM trends, BG are low 3% of the time, rest within target/normal.   Physical Exam  BP 120/70   Pulse 74   Ht 5' 5.75 (1.67 m)   Wt 150 lb (68 kg)   LMP 07/18/2024 (Exact Date)   SpO2 98%   BMI 24.40 kg/m    Constitutional: well developed, well nourished Head: normocephalic, atraumatic Eyes: sclera anicteric, no redness Neck: supple Lungs: normal respiratory effort Neurology: alert and oriented Skin: dry, no appreciable rashes Musculoskeletal: no appreciable defects Psychiatric: normal mood and affect   Current Medications Patient's Medications  New Prescriptions   No medications on file  Previous Medications   ADALIMUMAB-ADAZ (HYRIMOZ) 40 MG/0.4ML SOAJ    Inject 40 mg into the skin once a week.   BLOOD GLUCOSE MONITORING SUPPL DEVI    1 each by Does not apply route daily. May substitute to any manufacturer covered by patient's insurance.   CONTINUOUS GLUCOSE SENSOR (FREESTYLE LIBRE 3 PLUS SENSOR) MISC    Change sensor every 15 days.   FOLIC ACID (FOLVITE) 1 MG TABLET    Take 1 tablet by mouth daily.   MULTIPLE VITAMIN (MULTIVITAMIN) TABLET    Take 1 tablet by mouth daily.   NORETHINDRONE (MICRONOR) 0.35 MG TABLET    Take 1 tablet (0.35 mg total) by mouth daily.   SPIRONOLACTONE  (ALDACTONE ) 100 MG TABLET    Take 1 tablet (100 mg total) by mouth daily.  Modified Medications   No medications on file  Discontinued Medications   No medications on file    Allergies Allergies  Allergen Reactions   Amoxicillin  Anaphylaxis   Amoxicillin -Pot Clavulanate     REACTION: nausea- but tolerates amoxil - this was verified with mother 02/04/11    Past Medical History Past Medical History:  Diagnosis Date   Anemia    Baby  premature 28-32 weeks    Heart murmur    JRA (juvenile rheumatoid arthritis) (HCC)    Juvenile ankylosing spondylitis (HCC)    Lipedema    Occipital neuralgia    PCOS (polycystic ovarian syndrome)    Ruptured ovarian cyst    Subclinical hyperthyroidism     Past Surgical History Past Surgical History:  Procedure Laterality Date   COLONOSCOPY N/A 12/21/2023   Procedure: COLONOSCOPY;  Surgeon: Onita Elspeth Sharper, DO;  Location: [facility] ENDOSCOPY;  Service: Gastroenterology;  Laterality: N/A;   ESOPHAGOGASTRODUODENOSCOPY N/A 12/21/2023   Procedure: EGD (ESOPHAGOGASTRODUODENOSCOPY);  Surgeon: Onita Elspeth Sharper, DO;  Location: [facility] ENDOSCOPY;  Service: Gastroenterology;  Laterality: N/A;   WISDOM TOOTH EXTRACTION      Family History family history includes Allergies in her mother; Ankylosing spondylitis in her mother; Cancer in her maternal grandmother; Celiac disease in her maternal grandmother and mother; Fibromyalgia in her mother; Hypothyroidism in her maternal aunt, maternal grandmother, and mother.  Social History Social History   Socioeconomic History   Marital status: Single    Spouse name: Not on file   Number of children: Not on file   Years of education: Not on file   Highest education level: Not on file  Occupational History   Not on file  Tobacco Use   Smoking  status: Never   Smokeless tobacco: Never   Tobacco comments:    No cigarette smoke exposure  Vaping Use   Vaping status: Never Used  Substance and Sexual Activity   Alcohol use: No   Drug use: No   Sexual activity: Yes    Partners: Male    Birth control/protection: Condom    Comment: Virgin, Menarche @ 55, First IC @ 29  Other Topics Concern   Not on file  Social History Narrative   Mom is engineer, site.  Dad is games developer.      Just graduated, going to [employer] for nursing. 23-24 school year.    Social Drivers of Corporate Investment Banker Strain: Not on file  Food Insecurity: Not  on file  Transportation Needs: Not on file  Physical Activity: Not on file  Stress: Not on file  Social Connections: Not on file  Intimate Partner Violence: Not on file    No results found for: CHOL No results found for: HDL No results found for: LDLCALC No results found for: TRIG No results found for: [facility] Lab Results  Component Value Date   CREATININE 0.95 10/13/2023   Lab Results  Component Value Date   GFR 81.10 05/28/2021      Component Value Date/Time   NA 138 10/13/2023 1355   K 3.7 10/13/2023 1355   CL 103 10/13/2023 1355   CO2 25 10/13/2023 1355   GLUCOSE 98 10/13/2023 1355   BUN 10 10/13/2023 1355   CREATININE 0.95 10/13/2023 1355   CREATININE 0.86 03/01/2023 1059   CALCIUM 9.2 10/13/2023 1355   PROT 7.6 10/13/2023 1355   ALBUMIN 4.4 10/13/2023 1355   AST 29 10/13/2023 1355   ALT 35 10/13/2023 1355   ALKPHOS 53 10/13/2023 1355   BILITOT 0.7 10/13/2023 1355   GFRNONAA >60 10/13/2023 1355      Latest Ref Rng & Units 10/13/2023    1:55 PM 10/08/2023    3:55 PM 03/01/2023   10:59 AM  BMP  Glucose 70 - 99 mg/dL 98  895  74   BUN 6 - 20 mg/dL 10  12  7    Creatinine 0.44 - 1.00 mg/dL 9.04  9.04  9.13   BUN/Creat Ratio 6 - 22 (calc)   SEE NOTE:   Sodium 135 - 145 mmol/L 138  135  140   Potassium 3.5 - 5.1 mmol/L 3.7  3.6  5.1   Chloride 98 - 111 mmol/L 103  99  104   CO2 22 - 32 mmol/L 25  24  30    Calcium 8.9 - 10.3 mg/dL 9.2  8.8  9.9        Component Value Date/Time   WBC 5.4 10/13/2023 1355   RBC 4.42 10/13/2023 1355   HGB 12.4 10/13/2023 1355   HCT 37.8 10/13/2023 1355   PLT 394 10/13/2023 1355   MCV 85.5 10/13/2023 1355   MCH 28.1 10/13/2023 1355   MCHC 32.8 10/13/2023 1355   RDW 14.4 10/13/2023 1355   LYMPHSABS 0.8 10/08/2023 1555   MONOABS 0.5 10/08/2023 1555   EOSABS 0.0 10/08/2023 1555   BASOSABS 0.0 10/08/2023 1555   Lab Results  Component Value Date   TSH 0.30 (L) 09/11/2023   TSH 0.19 (L) 01/13/2023   TSH 0.26 (L)  10/14/2022   FREET4 1.1 01/13/2023   FREET4 1.2 10/14/2022   FREET4 1.2 04/05/2022         Parts of this note may have been dictated using  voice recognition software. There may be variances in spelling and vocabulary which are unintentional. Not all errors are proofread. Please notify the dino if any discrepancies are noted or if the meaning of any statement is not clear.

## 2024-08-13 NOTE — Patient Instructions (Addendum)
 10-Point Nutrition Plan for Preventing Hypoglycemia  Control portions of carbohydrate - 30 grams/meal, 15 grams/snack. Choose low-glycemic carbohydrates. Avoid high-glycemic carbohydrates. Include (heart-healthy) fats in each meal or snack - 15 grams/meal, 5 grams/snack. Emphasize optimal protein intake. Space meals/snacks 3-4 hours apart. Avoid consuming liquids with meals. Avoid alcohol. Avoid caffeine.     Low Glycemic Index Carbohydrates (CHOOSE) Steel-cut oats (regular, not quick-cook or instant) Oat bran cereal Beans/legumes (e.g., garbanzo, navy, kidney, lima, pinto, black-eyed and pea beans, edamame (soybeans), lentils Bean products (e.g., hummus, tofu) Pearled barley, cooked al dente Yams Some fruits (e.g., grapefruit, apples, pears, berries, apricots, peaches) Some pasta (e.g., Barilla Plus pasta), cooked al dente Some whole grain breads (e.g., Ezekiel bread, Joseph's Flax, Oat Bran & Whole Wheat Pita/Lavash/Tortillas) Some whole grain crackers (e.g., RyKrisp, RyVita, Wasa) Brown rice, wild rice Quinoa  Buckwheat (a grass)   High Glycemic Index Carbohydrates (AVOID) Refined breakfast cereals (e.g., Corn Flakes, Rice Krispies, Cream of Rice, instant oatmeal) Regular pasta Most starchy vegetables (e.g., white potatoes, corn, winter (orange) squash) White rice, rice cakes Popcorn, pretzels, chips Some fruits (e.g., ripe bananas, pineapple, mango, watermelon, grapes) All fruit juices and sweetened drinks (e.g., sodas, sweetened iced tea) Bread, rolls, bagels, English muffins, and crackers made with refined flour Sweets (e.g., candy, cake, cookies, ice cream, syrup)   Heart-Healthy Fats (Adapt) Nuts, nut butters Avocado, guacamole Olives Most plant oils (e.g., olive, canola, peanut, soy, sunflower, sesame) Most seeds (e.g., sunflower, flax, sesame/sesame tahini) Oily fish (e.g., salmon, bluefish, mackerel, tuna, sardines)

## 2024-08-14 ENCOUNTER — Encounter: Attending: "Endocrinology | Admitting: Nutrition

## 2024-08-14 DIAGNOSIS — Z713 Dietary counseling and surveillance: Secondary | ICD-10-CM | POA: Diagnosis not present

## 2024-08-14 DIAGNOSIS — E162 Hypoglycemia, unspecified: Secondary | ICD-10-CM | POA: Diagnosis present

## 2024-08-26 ENCOUNTER — Encounter: Admitting: Nutrition

## 2024-08-27 ENCOUNTER — Encounter: Attending: "Endocrinology | Admitting: Dietician

## 2024-08-27 ENCOUNTER — Encounter: Payer: Self-pay | Admitting: Dietician

## 2024-08-27 DIAGNOSIS — E162 Hypoglycemia, unspecified: Secondary | ICD-10-CM | POA: Insufficient documentation

## 2024-08-27 NOTE — Progress Notes (Signed)
 Medical Nutrition Therapy  Appointment Start time:  1600  Appointment End time:  1700  Primary concerns today: hypoglycemia.  Has passed out when she was 18 yo (unsure if due to a low at that time), passed out at 18 yo, passes out several times per year when her blood glucose gets to 40.  Low sensor reading of 37 two weeks ago.  She keeps a hypoglycemia kit that includes an extra [place], Blood glucose monitor, crackers, glucose tabs and glucose gels. She states that she is going on a 5 hour flight tomorrow and states that she frequently is low. She states that she drops in the 50's daily.  She confirms this with a finger stick.   Sensor reading currently 75.    She states that she lost about 15 lbs in January due to intestinal issues (large intestine dilated - mega colon).  She has since regained weight.  Bowel movement q 1-2 days - sometimes pebbles. Appetite is low and used to skip meals because she wasn't hungry. Previously on Metformin  for PCOS.  Referral diagnosis: hypoglycemia Preferred learning style: no preference indicated Learning readiness: ready, change in progress   NUTRITION ASSESSMENT  67 148 lbs 08/27/2024 UBW 145-150 lbs  Clinical Medical Hx: positive HLB27 (which makes her increased risk of crohn's or ulcerative colitis, negative test for celiac), rheumatoid arthritis Ankylosing spondylitis, melanoma, abnormal liver levels, TMJ, subclinical hyperthyroidism, raynaud's, lymphedema, occipital neuropathy, PCOS (excess androgen hormones), Vitamin D  deficiency, occipital neuralgia Medications: spironolactone , norethindrone Labs: Lipase:  A1C 5.1% 08/13/2024, TSH 0.2 on 06/28/2024, 119 10/13/2023 (usually high), eGFR >60 10/13/2023 Notable Signs/Symptoms: hypoglycemia  CGM:  Libre 16 low blood glucose alerts in the past 14 days.  CGM Results from download: 08/27/2024  % Time CGM active:    %   (Goal >70%)  Average glucose:   89 mg/dL for 14 days  Glucose management indicator:    5.4 %  Time in range (70-180 mg/dL):   90 %   (Goal >29%)  Time High (181-250 mg/dL):   0 %   (Goal < 74%)  Time Very High (>250 mg/dL):    0 %   (Goal < 5%)  Time Low (54-69 mg/dL):   9 %   (Goal <5%)  Time Very Low (<54 mg/dL):   1 %   (Goal <8%)  %CV (glucose variability)     %  (Goal <36%)    Lifestyle & Dietary Hx Patient lives with her parents.  She and her father do the cooking. She goes to [employer] and is taking nursing. 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 Low blood glucose with caffeine, no alcohol. Dislikes bread. Lactose intolerance  Used to eat a high protein diet.  She and her family have done competitive weight lifting.  Estimated daily fluid intake: 96 oz Supplements: multivitamin Sleep: wakes multiple times per night due to low blood glucose Stress / self-care: normal Current average weekly physical activity: has been told not to exercise currently but usually exercises daily  24-Hr Dietary Recall First Meal: protein bar, blackberries Snack: none Second Meal: grilled chicken, pita bread (1/2 pita) Snack: none Third Meal: chik fillet - grilled chicken nuggets and fries OR fried pork chops, fried okra, turnip greens, mashed potatoes, pintos Snack: berries, chop stick OR [place] Balanced break (cheese, nuts, cranberries) Beverages: water  Estimated Energy Needs Calories: 2000-2200 Protein: 80-90 g  NUTRITION DIAGNOSIS  NB-1.1 Food and nutrition-related knowledge deficit As related to balance  of carbohydrate, protein, and fat.  As evidenced by diet hx and patient report.   NUTRITION INTERVENTION  Nutrition education (E-1) on the following topics:  Nutrition quality Increased fiber Whole grains vs refined grains Continue adequate fluid Avoiding lows - small frequent meals and snacks with small portions of protein.  Avoid skipping meals. Treatment of lows  Vitamin D  deficiency history Confirm sensor reading with blood  glucose meter.  For young women a blood glucose above 60 may be normal.  Monitory symptoms and treat symptoms.  Monitor blood glucose trends.  Handouts Provided Include  Meal plan card Snack sheet  Learning Style & Readiness for Change Teaching method utilized: Visual & Auditory  Demonstrated degree of understanding via: Teach Back  Barriers to learning/adherence to lifestyle change: none  Goals Established by Pt Nutrition quality 3 meals plus snacks daily. Each meal should have a small portion of protein. Whole grains are better than refined grains Whole wheat flour rather than white flour Use Chia seeds only if soaked Increase your vegetables, particularly greens. Continue to limit  refined sugar unless treating a low. Bring extra snacks when flying   Chomp  Protein bars  Nuts  Raisins  PB crackers  Continues to keep a food diary. Consider vitamin D  2000 units daily   MONITORING & EVALUATION Dietary intake, weekly physical activity prn.  Next Steps  Patient is to call for questions.

## 2024-08-27 NOTE — Patient Instructions (Addendum)
 Nutrition quality 3 meals plus snacks daily. Each meal should have a small portion of protein. Whole grains are better than refined grains Whole wheat flour rather than white flour Use Chia seeds only if soaked Increase your vegetables, particularly greens. Continue to limit  refined sugar unless treating a low. Bring extra snacks when flying   Chomp  Protein bars  Nuts  Raisins  PB crackers  Continues to keep a food diary. Consider vitamin D  2000 units daily

## 2024-08-29 ENCOUNTER — Encounter: Payer: Self-pay | Admitting: "Endocrinology

## 2024-09-03 NOTE — Progress Notes (Signed)
 Patient is here because her blood sugar are always dropping low, despite taking no oral medications or insulin. She reports eating 3 meals and 3 snacks.   CGM: Libre 3Plus 10% time with blood sugars in the 50s-70s ac meals.  Pt. Is symptomatic at times, dispite snacks 2hr. Pc meals.   Diet :  meals are balanced, but most times supper is high in fat which is a lot of vegetables with oil.  Parents are farmers and meat portion sizes are large.  She is consuming aproximately 1800-2000 calories with snacks.   Exercise:  none due to current problems Meal plan: meals are low in carb and fat.  Suggested she had more fat at the beginning of the meal to help to slow the blood sugar rise down.  Suggestions given for options to do this and discussed how and why this may help her with the low blood sugars she is experenceing  2 hours after eating.   She will call me in one week to see if this will help.

## 2024-09-16 ENCOUNTER — Telehealth: Payer: Self-pay | Admitting: Internal Medicine

## 2024-09-16 NOTE — Telephone Encounter (Signed)
 Looks like she has already seen endocrine through [facility]. Is she wanting a second opinion or?

## 2024-09-16 NOTE — Telephone Encounter (Signed)
 Copied from CRM [id number]. Topic: Referral - Question >> Sep 16, 2024  9:21 AM Michelle Snow wrote: Reason for CRM: Pt states she saw [name], NP on 07/24/24. States they talked about a referral to [facility] for her Hypoglycemia. States at the time she did not want to do it but was wondering if it could be done even before she does her [facility] appointment with him?

## 2024-09-17 ENCOUNTER — Telehealth: Payer: Self-pay | Admitting: Nurse Practitioner

## 2024-09-17 DIAGNOSIS — J111 Influenza due to unidentified influenza virus with other respiratory manifestations: Secondary | ICD-10-CM

## 2024-09-17 MED ORDER — OSELTAMIVIR PHOSPHATE 75 MG PO CAPS: 75.0000 mg | ORAL_CAPSULE | Freq: Two times a day (BID) | ORAL | 0 refills | Status: AC

## 2024-09-17 NOTE — Telephone Encounter (Signed)
 Spoke to pt's mom. Did a home Covid & Flu Test yesterday evening and tested positive for Flu A. Symptoms started late 2 nights ago. Having fever, runny nose, cough, headache, nausea. She has RA.   They are very frustrated that they were told a nurse would call her and she never received a call. That is when her mother called at 3:17. I did not get to my messages until a few minutes ago and called them right away. I explained that it was probably triage nurses from outside of our office who were supposed to have called her and that when she is calling out office, she is not technically speaking to a person at our practice. I explained I understand the frustration and will get this message to a provider who is in the office right now to try and get this taken care of. I did say that they may require a VV but because it is so late in the day there are no appts available and she is nearing the 48 hour window for Tamiflu .   Will see if Dr Bennett will help her with medication and will then forward the message to a manager to look in to if was actually contacted or not.

## 2024-09-17 NOTE — Telephone Encounter (Signed)
 Tamiflu sent.

## 2024-09-17 NOTE — Telephone Encounter (Unsigned)
 Copied from CRM #[id number]. Topic: Clinical - Prescription Issue >> Sep 17, 2024  3:35 PM Harlene ORN wrote: Reason for CRM: Mother of patient called to get the patient medication for the patient who is positive for Flu and not feel well at all. Front desk said that the nurse will reach out to her before the end of the day to discuss with mother.

## 2024-09-17 NOTE — Telephone Encounter (Signed)
 Thank you. I called and spoke to pt's mom per DPR. Advised her that the Tamiflu  was sent in. If she is needing something for her symptoms, that would require a virtual visit or in office visit for further evaluation.

## 2024-09-17 NOTE — Telephone Encounter (Signed)
 Copied from CRM #[id number]. Topic: Clinical - Medication Question >> Sep 17, 2024  7:37 AM Carlyon D wrote: Reason for CRM: Pt is calling that she tested positive for the Flu strand A took an at home test Pt is asking if Tamiflu  can be sent over to her pharmacy. Preferred pharmacy:   [facility] [address] [place] [address] [place] [address] Phone: [phone] Fax: [phone]

## 2024-09-17 NOTE — Telephone Encounter (Signed)
 Attempted to contact pt but the call would not go through x2.

## 2024-09-17 NOTE — Addendum Note (Signed)
 Addended by: Ladawn Boullion K on: 09/17/2024 04:46 PM   Modules accepted: Orders

## 2024-09-17 NOTE — Telephone Encounter (Signed)
 Patient can do a virtual visit. If we have openings in office or through [facility]

## 2024-09-17 NOTE — Telephone Encounter (Unsigned)
 Copied from CRM #[id number]. Topic: Clinical - Red Word Triage >> Sep 17, 2024  3:17 PM Alfonso HERO wrote: Patient has been diagnosed with the flu as of yesterday and has requested Tamiflu  this morning. I informed her mother that the nurse has been trying to call the patient and hasn't been able to get through. Her mother is on the phone asking to speak to someone about her being seen today. I advised that there is nothing until trmw her mother keeps insisting she be seen today or the meds need to be given since it has to be taken in a certain time frame. I called [facility] to see if the nurse that has been trying to reach the patient was available and was told she is gone for the day. The patient's mother hung up while I was on the phone with CAL.

## 2024-09-23 NOTE — Telephone Encounter (Signed)
 Called and spoke with patient.  Pt complains of need for a new referral to [facility] because they specialize in hypoglycemia.  [facility] specializes in diabetes.  Pt states that her sugars were still crashing in the 80s and 120s.  Pt complains that the provider was still unsure of the cause for hypoglycemia and wants to be referred to [facility].  Please advise.

## 2024-09-23 NOTE — Addendum Note (Signed)
 Addended by: WENDEE LYNWOOD HERO on: 09/23/2024 04:49 PM   Modules accepted: Orders

## 2024-09-23 NOTE — Telephone Encounter (Signed)
 Left a detailed voicemail for patient to call the office back.

## 2024-09-23 NOTE — Telephone Encounter (Signed)
 Referral to [facility] placed

## 2024-09-24 ENCOUNTER — Telehealth: Payer: Self-pay

## 2024-09-24 NOTE — Telephone Encounter (Signed)
 Patient called and states that she is having irregular bleeding while taking norethindrone . She says that she bled for 7 days starting 11/6 then stopped for 5 days started to bleed heavy again at that time for 10 days then didn't bleed for 1 1/2 weeks then started bleeding again last night. Patient requested appointment with provider. Scheduled with Tiffany on 12/17 at 8:45am

## 2024-09-25 ENCOUNTER — Ambulatory Visit: Admitting: Nurse Practitioner

## 2024-09-25 ENCOUNTER — Encounter: Payer: Self-pay | Admitting: Nurse Practitioner

## 2024-09-25 VITALS — BP 120/78 | HR 68 | Resp 16

## 2024-09-25 DIAGNOSIS — N926 Irregular menstruation, unspecified: Secondary | ICD-10-CM | POA: Diagnosis not present

## 2024-09-25 DIAGNOSIS — E282 Polycystic ovarian syndrome: Secondary | ICD-10-CM | POA: Diagnosis not present

## 2024-09-25 LAB — PREGNANCY, URINE: Preg Test, Ur: NEGATIVE

## 2024-09-25 NOTE — Progress Notes (Signed)
° °  Acute Office Visit  Subjective:    Patient ID: Michelle Snow, female    DOB: [birth year], 18 y.o.   MRN: [id number]   HPI 18 y.o. presents today for bleeding. Bled 08/14/24 x 7 days, bleeding returned 5 days later for 4 days, then again a week and a half after that. H/O PCOS. Started POPs 2 months ago. Hesitant to do estrogen due to mother's history of breast cancer, educated on this not being a contraindication. Also has lipodema and worried about estrogen use. H/O subclinical hyperthyroidism, which may be patient's baseline. Not currently sexually active, boyfriend in [employer].   Patient's last menstrual period was 09/23/2024 (exact date). Period Pattern: (!) Irregular Menstrual Flow: Heavy Menstrual Control: Maxi pad, Tampon Dysmenorrhea: (!) Mild Dysmenorrhea Symptoms: Cramping, Other (Comment), Headache (bloating, upset stomach)  Review of Systems  Constitutional: Negative.   Genitourinary:  Positive for menstrual problem.       Objective:    Physical Exam Constitutional:      Appearance: Normal appearance.     BP 120/78   Pulse 68   Resp 16   LMP 09/23/2024 (Exact Date)  Wt Readings from Last 3 Encounters:  08/13/24 150 lb (68 kg)  07/24/24 150 lb 12.8 oz (68.4 kg)  07/19/24 145 lb 9.6 oz (66 kg)        UPT neg  Assessment & Plan:   Problem List Items Addressed This Visit       Endocrine   PCOS (polycystic ovarian syndrome)   Other Visit Diagnoses       Irregular bleeding    -  Primary   Relevant Orders   Pregnancy, urine (Completed)      Plan: Neg UPT. Educated on adjustment period of 3-6 months with OCPs. Option to switch to alternative or give POPs more time. Wants to monitor for now. Discussed other options. Interested in IUD if irregular bleeding continues.    Return if symptoms worsen or fail to improve.    Michelle DELENA Shutter DNP, 10:04 AM 09/25/2024

## 2024-09-26 ENCOUNTER — Ambulatory Visit: Admitting: "Endocrinology

## 2024-10-08 ENCOUNTER — Encounter: Payer: Self-pay | Admitting: Nurse Practitioner

## 2024-10-08 DIAGNOSIS — E282 Polycystic ovarian syndrome: Secondary | ICD-10-CM

## 2024-10-08 MED ORDER — NORETHINDRONE 0.35 MG PO TABS: 1.0000 | ORAL_TABLET | Freq: Every day | ORAL | 1 refills | Status: AC

## 2024-10-08 NOTE — Telephone Encounter (Signed)
 Medication refill request: micronor  Last AEX:  02-26-24 Next AEX: not scheduled Last MMG (if hormonal medication request): none Refill authorized: rx sent to pharmacy supply with 1 refill

## 2024-10-16 NOTE — Patient Instructions (Signed)
 Add 1 extra fat serving to each meal and choose high fat snacks like nuts and peanut butter

## 2024-10-31 ENCOUNTER — Ambulatory Visit: Admitting: "Endocrinology

## 2025-01-22 ENCOUNTER — Encounter: Admitting: Nurse Practitioner
